# Patient Record
Sex: Female | Born: 1980 | Race: White | Hispanic: Yes | Marital: Married | Smoking: Never smoker
Health system: Southern US, Community
[De-identification: ages and names within clinical notes are randomized; demographics above are authoritative.]

## PROBLEM LIST (undated history)

## (undated) ENCOUNTER — Inpatient Hospital Stay (HOSPITAL_COMMUNITY): Payer: Self-pay

## (undated) DIAGNOSIS — O24419 Gestational diabetes mellitus in pregnancy, unspecified control: Secondary | ICD-10-CM

## (undated) DIAGNOSIS — D649 Anemia, unspecified: Secondary | ICD-10-CM

## (undated) DIAGNOSIS — E119 Type 2 diabetes mellitus without complications: Secondary | ICD-10-CM

## (undated) DIAGNOSIS — R809 Proteinuria, unspecified: Secondary | ICD-10-CM

## (undated) HISTORY — DX: Type 2 diabetes mellitus without complications: E11.9

## (undated) HISTORY — DX: Proteinuria, unspecified: R80.9

## (undated) HISTORY — DX: Gestational diabetes mellitus in pregnancy, unspecified control: O24.419

## (undated) HISTORY — PX: NO PAST SURGERIES: SHX2092

---

## 2005-12-23 ENCOUNTER — Emergency Department (HOSPITAL_COMMUNITY): Admission: EM | Admit: 2005-12-23 | Discharge: 2005-12-23 | Payer: Self-pay | Admitting: Emergency Medicine

## 2010-12-18 ENCOUNTER — Inpatient Hospital Stay (HOSPITAL_COMMUNITY)
Admission: AD | Admit: 2010-12-18 | Discharge: 2010-12-18 | Disposition: A | Discharge status: Home / Self Care | Payer: Self-pay | Source: Ambulatory Visit | Attending: Obstetrics & Gynecology | Admitting: Obstetrics & Gynecology

## 2010-12-18 ENCOUNTER — Inpatient Hospital Stay (HOSPITAL_COMMUNITY): Payer: Self-pay

## 2010-12-18 ENCOUNTER — Encounter (HOSPITAL_COMMUNITY): Payer: Self-pay | Admitting: *Deleted

## 2010-12-18 DIAGNOSIS — O209 Hemorrhage in early pregnancy, unspecified: Secondary | ICD-10-CM | POA: Insufficient documentation

## 2010-12-18 LAB — URINALYSIS, ROUTINE W REFLEX MICROSCOPIC
Leukocytes, UA: NEGATIVE
Nitrite: NEGATIVE
Specific Gravity, Urine: >1.03 — ABNORMAL HIGH (ref 1.005–1.030)
Urobilinogen, UA: 0.2 mg/dL (ref 0.0–1.0)

## 2010-12-18 LAB — POCT PREGNANCY, URINE: Preg Test, Ur: POSITIVE

## 2010-12-18 LAB — CBC
MCH: 25.2 pg — ABNORMAL LOW (ref 26.0–34.0)
Platelets: 270 10*3/uL (ref 150–400)
RBC: 4.44 MIL/uL (ref 3.87–5.11)
WBC: 7.2 10*3/uL (ref 4.0–10.5)

## 2010-12-18 LAB — URINE MICROSCOPIC-ADD ON

## 2010-12-18 MED ORDER — PROMETHAZINE HCL 25 MG PO TABS: 25.0000 mg | ORAL_TABLET | Freq: Three times a day (TID) | ORAL | Status: AC | PRN

## 2010-12-18 NOTE — ED Provider Notes (Signed)
History     Chief Complaint  Patient presents with  . Vaginal Bleeding  . Emesis   HPI Joanne Roberts is 30 y.o. G3P2002 [redacted]w[redacted]d weeks presenting with vaginal bleeding.  Plans prenatal care at health dept.  Saw pink discharge 6 days ago and then today it was red.  Last intercourse more than 1 month ago. Had fever 6 days ago. Has had nausea and vomiting this week.  Has a cold not sure if it is the cold or the pregnancy.  Has "pin prick" pain in her lower abdomen bilaterally today.     Past Medical History  Diagnosis Date  . No pertinent past medical history     Past Surgical History  Procedure Date  . No past surgeries     No family history on file.  History  Substance Use Topics  . Smoking status: Never Smoker   . Smokeless tobacco: Not on file  . Alcohol Use: No    Allergies: No Known Allergies  Prescriptions prior to admission  Medication Sig Dispense Refill  . acetaminophen (TYLENOL) 325 MG tablet Take 650 mg by mouth every 6 (six) hours as needed. Patient used this medication for pain.         Review of Systems  Constitutional: Negative.   HENT: Negative.   Gastrointestinal: Positive for abdominal pain (pin prick pain lower abdomen bilaterally).  Genitourinary:       + vaginal bleeding   Physical Exam   Blood pressure 116/84, pulse 63, temperature 98.1 F (36.7 C), temperature source Oral, resp. rate 20, height 5' 1.5" (1.562 m), weight 180 lb 9.6 oz (81.92 kg), last menstrual period 09/20/2010, SpO2 99.00%.  Physical Exam  Constitutional: She is oriented to person, place, and time. She appears well-developed and well-nourished. No distress.  HENT:  Head: Normocephalic.  Neck: Normal range of motion.  Cardiovascular: Normal rate.   Respiratory: Effort normal.  GI: Soft. She exhibits no distension and no mass. There is no tenderness. There is no rebound and no guarding.  Genitourinary: Uterus is enlarged (measures less than 12 weeks in size). Uterus is not  tender. Right adnexum displays no mass, no tenderness and no fullness. Left adnexum displays no mass, no tenderness and no fullness. There is bleeding (pink tinged discharge) around the vagina. Vaginal discharge (malodorous discharge) found.  Neurological: She is alert and oriented to person, place, and time.  Skin: Skin is warm and dry.   Results for orders placed during the hospital encounter of 12/18/10 (from the past 24 hour(s))  URINALYSIS, ROUTINE W REFLEX MICROSCOPIC     Status: Abnormal   Collection Time   12/18/10  3:30 PM      Component Value Range   Color, Urine YELLOW  YELLOW    APPearance CLEAR  CLEAR    Specific Gravity, Urine >1.030 (*) 1.005 - 1.030    pH 5.5  5.0 - 8.0    Glucose, UA NEGATIVE  NEGATIVE (mg/dL)   Hgb urine dipstick TRACE (*) NEGATIVE    Bilirubin Urine NEGATIVE  NEGATIVE    Ketones, ur NEGATIVE  NEGATIVE (mg/dL)   Protein, ur NEGATIVE  NEGATIVE (mg/dL)   Urobilinogen, UA 0.2  0.0 - 1.0 (mg/dL)   Nitrite NEGATIVE  NEGATIVE    Leukocytes, UA NEGATIVE  NEGATIVE   URINE MICROSCOPIC-ADD ON     Status: Abnormal   Collection Time   12/18/10  3:30 PM      Component Value Range   Squamous Epithelial / LPF  MANY (*) RARE    RBC / HPF 0-2  <3 (RBC/hpf)   Bacteria, UA RARE  RARE   POCT PREGNANCY, URINE     Status: Normal   Collection Time   12/18/10  3:56 PM      Component Value Range   Preg Test, Ur POSITIVE    WET PREP, GENITAL     Status: Abnormal   Collection Time   12/18/10  5:00 PM      Component Value Range   Yeast, Wet Prep NONE SEEN  NONE SEEN    Trich, Wet Prep NONE SEEN  NONE SEEN    Clue Cells, Wet Prep NONE SEEN  NONE SEEN    WBC, Wet Prep HPF POC MANY (*) NONE SEEN   ABO/RH     Status: Normal   Collection Time   12/18/10  5:15 PM      Component Value Range   ABO/RH(D) A POS    CBC     Status: Abnormal   Collection Time   12/18/10  5:15 PM      Component Value Range   WBC 7.2  4.0 - 10.5 (K/uL)   RBC 4.44  3.87 - 5.11 (MIL/uL)    Hemoglobin 11.2 (*) 12.0 - 15.0 (g/dL)   HCT 16.1 (*) 09.6 - 46.0 (%)   MCV 77.5 (*) 78.0 - 100.0 (fL)   MCH 25.2 (*) 26.0 - 34.0 (pg)   MCHC 32.6  30.0 - 36.0 (g/dL)   RDW 04.5 (*) 40.9 - 15.5 (%)   Platelets 270  150 - 400 (K/uL)       *RADIOLOGY REPORT*  Clinical Data: size<dates, bleeding/mild pain bilaterally,  OBSTETRIC <14 WK [place]  Technique: Transabdominal ultrasound examination was performed for  complete evaluation of the gestation as well as the maternal  uterus, adnexal regions, and pelvic cul-de-sac.  Comparison: None.  Findings: There is a single intrauterine gestation. Crown-rump  length is 1.09 cm for an estimated gestational age of [redacted] weeks 2  days. Fetal heart rate 158 beats per minute. Small subchorionic  hemorrhage present.  Resolving right corpus luteal cyst. No adnexal masses or free  fluid.  IMPRESSION:  7-week-2-day intrauterine pregnancy. Fetal heart rate 158 beats  per minute. Small subchorionic hemorrhage.  Original Report Authenticated By: Cyndie Chime, M.D.    MAU Course  Procedures GC/CHL culture to lab  MDM  At discharge patient is asking for Rx for  Nausea.  Will write Phenergan tabs  Assessment and Plan  A: Intrauterine pregnancy at [redacted]w[redacted]d with small subchorionic hemorrhage     Vaginal bleeding in early pregnancy  P:  Avoid intercourse until bleeding resolves      Begin prenatal care with the doctor of your choice  Matt Holmes 12/18/2010, 4:44 PM   Matt Holmes, NP 12/18/10 1823  Matt Holmes, NP 12/18/10 1830

## 2010-12-18 NOTE — Progress Notes (Signed)
Patient states she has had a positive home pregnancy test. Has been having nausea and vomiting for about one week. Has some on and off pin prick like pain on both sides of the abdomen. States she had bright red bleeding this am, but none now.

## 2010-12-19 LAB — GC/CHLAMYDIA PROBE AMP, GENITAL
Chlamydia, DNA Probe: NEGATIVE
GC Probe Amp, Genital: NEGATIVE

## 2011-01-13 NOTE — L&D Delivery Note (Signed)
Delivery Note At 2:35 PM a viable and healthy female was delivered via spontaneous vaginal delivery  (Presentation: occiput anterior  ).  APGAR: pending ; weight .   Placenta status: intact, .  Cord: 3 vessels, centrally located  with the following complications: none.   Anesthesia:  none Episiotomy: none Lacerations: 1st degree Suture Repair: 2.0 vicryl Est. Blood Loss (mL): 300  Mom to postpartum.  Baby to nursery-stable.  Marikay Alar 07/29/2011, 3:10 PM

## 2011-01-13 NOTE — L&D Delivery Note (Signed)
I attended delivery. Agree with note.  No difficulty with shoulders.

## 2011-03-28 ENCOUNTER — Encounter (HOSPITAL_COMMUNITY): Payer: Self-pay | Admitting: Obstetrics and Gynecology

## 2011-03-28 ENCOUNTER — Inpatient Hospital Stay (HOSPITAL_COMMUNITY)
Admission: AD | Admit: 2011-03-28 | Discharge: 2011-03-28 | Disposition: A | Discharge status: Home / Self Care | Payer: Self-pay | Source: Ambulatory Visit | Attending: Family Medicine | Admitting: Family Medicine

## 2011-03-28 ENCOUNTER — Inpatient Hospital Stay (HOSPITAL_COMMUNITY): Payer: Self-pay

## 2011-03-28 DIAGNOSIS — O4690 Antepartum hemorrhage, unspecified, unspecified trimester: Secondary | ICD-10-CM | POA: Insufficient documentation

## 2011-03-28 DIAGNOSIS — O4692 Antepartum hemorrhage, unspecified, second trimester: Secondary | ICD-10-CM

## 2011-03-28 LAB — URINALYSIS, ROUTINE W REFLEX MICROSCOPIC
Bilirubin Urine: NEGATIVE
Ketones, ur: NEGATIVE mg/dL
Nitrite: NEGATIVE
pH: 7 (ref 5.0–8.0)

## 2011-03-28 LAB — URINE MICROSCOPIC-ADD ON

## 2011-03-28 LAB — CBC
HCT: 28.3 % — ABNORMAL LOW (ref 36.0–46.0)
Hemoglobin: 8.9 g/dL — ABNORMAL LOW (ref 12.0–15.0)
RDW: 15.9 % — ABNORMAL HIGH (ref 11.5–15.5)
WBC: 8.9 10*3/uL (ref 4.0–10.5)

## 2011-03-28 LAB — WET PREP, GENITAL: Yeast Wet Prep HPF POC: NONE SEEN

## 2011-03-28 NOTE — MAU Provider Note (Signed)
  History     CSN: [id number]  Arrival date and time: 03/28/11 1416   First Provider Initiated Contact with Patient 03/28/11 1443      Chief Complaint  Patient presents with  . Vaginal Bleeding  . Abdominal Cramping   HPI Patient presents complaining of vaginal bleeding that began this morning.  She is also having some lower pelvic pain/cramping.  She is a G3P2002 at [redacted]w[redacted]d.  She has not yet seen a doctor for prenatal care except for a visit to the MAU when she was 7 weeks and had some bleeding.  She denies leakage of fluid, and reports good fetal movement.   OB History    Grav Para Term Preterm Abortions TAB SAB Ect Mult Living   3 2 2  0 0 0 0 0 0 2      Past Medical History  Diagnosis Date  . No pertinent past medical history     Past Surgical History  Procedure Date  . No past surgeries     History reviewed. No pertinent family history.  History  Substance Use Topics  . Smoking status: Never Smoker   . Smokeless tobacco: Not on file  . Alcohol Use: No    Allergies: No Known Allergies  Prescriptions prior to admission  Medication Sig Dispense Refill  . acetaminophen (TYLENOL) 325 MG tablet Take 650 mg by mouth every 6 (six) hours as needed. Patient used this medication for pain.         ROS: Pertinent items in HPI.  Physical Exam   Blood pressure 118/55, pulse 85, temperature 98.3 F (36.8 C), temperature source Oral, resp. rate 18, last menstrual period 09/20/2010.  Physical Exam  Constitutional: She appears well-developed and well-nourished. No distress.  HENT:  Head: Normocephalic and atraumatic.  Cardiovascular: Normal rate, regular rhythm and normal heart sounds.   Respiratory: Effort normal and breath sounds normal.  GI:       Gravid, non-tender.   Genitourinary: Vagina normal.       There is blood in cervical os and vaginal vault.  No clots.     Lab Results  Component Value Date   WBC 8.9 03/28/2011   HGB 8.9* 03/28/2011   HCT 28.3* 03/28/2011    MCV 77.3* 03/28/2011   PLT 259 03/28/2011    MAU Course  Procedures  MDM Pre-viable pregnancy, pt presents with bleeding, 7 week ultrasound showed small subchorionic hemorrhage.  Obtained GC/Chlamydia, Wet prep, and UA, will also get [place] to eval fetus and placenta.  03/28/2011 3:17 PM   US shows normal fetal heart activity, normal cervix, Amniotic Fluid, with no placental abruption or previa.     Assessment and Plan  Second trimester bleeding, unclear etiology.  Will discharge hope, patient has appointment at [facility] clinic in about two weeks.  Advised her to start taking over the counter iron pills.   Joanne Roberts 03/28/2011, 3:06 PM

## 2011-03-28 NOTE — Discharge Instructions (Signed)
Hemorragia vaginal durante el embarazo, segundo trimestre (Vaginal Bleeding During Pregnancy, Second Trimester) Durante el embarazo es relativamente frecuente que se presente una pequea hemorragia (manchas). Esta situacin generalmente mejora por s misma. Existen muchas causas para la hemorragia o prdidas durante el embarazo. Algunas hemorragias pueden estar relacionadas al [employer] y otras no. Si aparecen retortijones con la hemorragia es ms serio y preocupante. Informe al mdico si tiene cualquier tipo de hemorragia vaginal.  CAUSAS  Infeccin, inflamacin o tumores en el tero.   La placenta puede estar cubriendo la apertura del tero de [place] total o parcial.   La placenta puede haberse separado del tero.   Usted puede estar sufriendo un trabajo de parto prematuro.   El tero no est lo suficientemente fuerte para Pharmacologist al beb dentro del tero (insuficiencia cervical).   Muchos quistes pequeos en el tero en lugar del tejido del embarazo (embarazo molar).  SNTOMAS  Hemorragia o goteo vaginal con o sin retortijones.   Contracciones uterinas.   Flujo vaginal anormal.   Podra tener goteo despus de Management consultant.  DIAGNSTICO Para evaluar el embarazo, el mdico podr:  Education officer, environmental un examen plvico.   Tomar anlisis de [place].   Realizar una prueba de [place].  Es importante seguir las indicaciones del profesional que la asiste.  TRATAMIENTO  Evaluacin del embarazo con anlisis de [place] y [place].   Reposo en cama (slo levantarse para ir al bao).   Inmunizacin con Rho-gam si la madre es Rh negativo y el padre es Rh positivo.   Si tiene contracciones uterinas, se le podrn administrar medicamentos para detenerlas.   Si tiene insuficiencia cervical, se le podr realizar una sutura en el tero para cerrarlo.  INSTRUCCIONES PARA EL CUIDADO DOMICILIARIO  Si el mdico le indica reposo en cama, usted necesitar hacer algunos arreglos para  que otra persona se ocupe del cuidado de los nios y de otras responsabilidades adicionales. Sin embargo, podr autorizarla a Building surveyor.   Lleve un registro de la cantidad y la saturacin de las toallas higinicas que Landscape architect. Escriba esta informacin:   No use tampones. No utilice duchas vaginales.   No tenga relaciones sexuales u orgasmos hasta que el mdico la autorice.   Guarde una muestra de los tejidos para que el profesional lo inspeccione.   Tome medicamentos para el dolor slo con el permiso del mdico.   No tome aspirina, ya que puede causar hemorragias.   No realice ejercicio, actividades estresantes ni levante peso sin el permiso del mdico.  SOLICITE ATENCIN MDICA INMEDIATAMENTE SI:  Siente calambres intensos en el estmago, en la espalda o en el vientre (abdomen).   Tiene contracciones uterinas.   Usted tiene una temperatura oral de ms de 102 F (38.9 C) y no puede controlarla con medicamentos.   Comienza a sentir escalofros.   Elimina cogulos o tejidos grandes.   La hemorragia aumenta o se siente mareada dbil o tiene episodios de desmayos.   Tiene una prdida de lquido por la vagina.  Document Released: 10/08/2004 Document Revised: 12/18/2010 [facility] Patient Information 2012 [place], [place].

## 2011-03-28 NOTE — MAU Note (Signed)
Pt presents to MAU with chief complaint of vaginal spotting and abdominal cramping. Pt is 21w4 days; G3P2; no history of pre-term delivery. Pain and spotting started this morning. Pt says she has been more uncomfortable lately.

## 2011-03-28 NOTE — MAU Provider Note (Signed)
Chart reviewed and agree with management and plan.  

## 2011-03-30 LAB — GC/CHLAMYDIA PROBE AMP, GENITAL: Chlamydia, DNA Probe: NEGATIVE

## 2011-04-08 ENCOUNTER — Encounter: Payer: Self-pay | Admitting: Physician Assistant

## 2011-04-08 ENCOUNTER — Ambulatory Visit (INDEPENDENT_AMBULATORY_CARE_PROVIDER_SITE_OTHER): Payer: Self-pay | Admitting: Physician Assistant

## 2011-04-08 VITALS — BP 100/74 | Temp 97.1°F | Wt 181.6 lb

## 2011-04-08 DIAGNOSIS — O0992 Supervision of high risk pregnancy, unspecified, second trimester: Secondary | ICD-10-CM | POA: Insufficient documentation

## 2011-04-08 DIAGNOSIS — Z3490 Encounter for supervision of normal pregnancy, unspecified, unspecified trimester: Secondary | ICD-10-CM

## 2011-04-08 DIAGNOSIS — O99019 Anemia complicating pregnancy, unspecified trimester: Secondary | ICD-10-CM | POA: Insufficient documentation

## 2011-04-08 DIAGNOSIS — O4690 Antepartum hemorrhage, unspecified, unspecified trimester: Secondary | ICD-10-CM | POA: Insufficient documentation

## 2011-04-08 LAB — POCT URINALYSIS DIP (DEVICE)
Bilirubin Urine: NEGATIVE
Glucose, UA: NEGATIVE mg/dL
Nitrite: NEGATIVE

## 2011-04-08 NOTE — Progress Notes (Signed)
Edema- feet, vaginal area.  Pain/pressure- tender on "top of vagina".  Pulse- 68

## 2011-04-08 NOTE — Patient Instructions (Signed)
Prevencin de parto prematuro (Preventing Preterm Labor) Un parto prematuro ocurre cuando la mujer embarazada tiene contracciones uterinas que causan la apertura, el acortamiento y el afinamiento del cuello del tero, antes de las 37 semanas de embarazo. Tendr contracciones regulares cada 2 a 3 minutos. Esto generalmente causa molestias o dolor. CUIDADOS EN EL HOGAR  Consuma una dieta saludable.   Tome las vitaminas segn le haya indicado el mdico.   Beba una cantidad de lquido suficiente como para mantener la orina de tono claro o color amarillo plido todos los das.   Descanse y duerma.   No tenga relaciones sexuales si tiene un parto prematuro o alto riesgo de tenerlo.   Siga las instrucciones del mdico acerca de su actividad, los medicamentos y los exmenes.   Evite el estrs.   Evite los esfuerzos extenuantes o la actividad fsica extensa.   No fume.  SOLICITE AYUDA DE INMEDIATO SI:   Tiene contracciones.   Siente dolor abdominal.   Tiene sangrado que proviene de la vagina.   Siente dolor al orinar.   Observa una secrecin anormal que proviene de la vagina.   Tiene una temperatura oral de ms de 102 F (38.9 C).  ASEGRESE DE QUE:  Comprende estas instrucciones.   Controlar su enfermedad.   Solicitar ayuda si no mejora o si empeora.  Document Released: 01/31/2010 Document Revised: 12/18/2010 [employer] Patient Information 2012 [employer]. 

## 2011-04-08 NOTE — Progress Notes (Signed)
   Subjective:    Joanne Roberts is a [id number] [redacted]w[redacted]d being seen today for her first obstetrical visit.  Her obstetrical history is significant for obesity. Patient does intend to breast feed. Pregnancy history fully reviewed.  Patient reports backache, bleeding (seen in MAU on 3/16 for this), no contractions, no cramping and no leaking.  Filed Vitals:   04/08/11 0819  BP: 100/74  Temp: 97.1 F (36.2 C)  Weight: 181 lb 9.6 oz (82.373 kg)    HISTORY: OB History    Grav Para Term Preterm Abortions TAB SAB Ect Mult Living   3 2 2  0 0 0 0 0 0 2     # Outc Date GA Lbr Len/2nd Wgt Sex Del Anes PTL Lv   1 TRM 2004 [redacted]w[redacted]d  6lb14oz(3.118kg) M SVD None  Yes   2 TRM 2007 [redacted]w[redacted]d  8lb12oz(3.969kg) F SVD None  Yes   3 CUR              Past Medical History  Diagnosis Date  . No pertinent past medical history    Past Surgical History  Procedure Date  . No past surgeries    No family history on file.   Exam    Uterine Size: 26 cm  Pelvic Exam:    Perineum: No Hemorrhoids   Vulva: normal   Vagina:  normal mucosa, normal discharge   pH: n/a   Cervix: no cervical motion tenderness and friable   Adnexa: normal adnexa   Bony Pelvis: average  System: Breast:  normal appearance, no masses or tenderness, deferred   Skin: normal coloration and turgor, no rashes    Neurologic: oriented, normal   Extremities: normal strength, tone, and muscle mass   HEENT extra ocular movement intact, sclera clear, anicteric, oropharynx clear, no lesions and neck supple with midline trachea   Mouth/Teeth mucous membranes moist, pharynx normal without lesions   Neck supple and no masses   Cardiovascular: regular rate and rhythm   Respiratory:  appears well, vitals normal, no respiratory distress, acyanotic, normal RR, ear and throat exam is normal, neck free of mass or lymphadenopathy, chest clear, no wheezing, crepitations, rhonchi, normal symmetric air entry   Abdomen: gravid, NTND   Urinary: urethral  meatus normal      Assessment:    Pregnancy: G3P2002 There is no problem list on file for this patient.       Plan:     Initial labs drawn.6} Prenatal vitamins.{GENETIC SCREENING JYNW:2956 Problem list reviewed and updated. Genetic Screening discussed : too late for screening.  Ultrasound discussed; fetal survey: beyond optimal dating range.  Follow up in 4 weeks. 50% of 40 min visit spent on counseling and coordination of care.  Continue prenatal vitamin Encouraged increased dietary iron Preterm, bleeding precautions reviewed Kick counts reviewed Discussed appropriate weight gain   BOOTH, Yacqub Baston 04/08/2011

## 2011-04-09 LAB — HIV ANTIBODY (ROUTINE TESTING W REFLEX): HIV: NONREACTIVE

## 2011-04-09 LAB — ANTIBODY SCREEN: Antibody Screen: NEGATIVE

## 2011-04-09 LAB — RPR

## 2011-04-09 LAB — GLUCOSE TOLERANCE, 1 HOUR: Glucose, 1 Hour GTT: 180 mg/dL — ABNORMAL HIGH (ref 70–140)

## 2011-04-10 LAB — HEMOGLOBINOPATHY EVALUATION
Hemoglobin Other: 0 %
Hgb S Quant: 0 %

## 2011-04-10 LAB — CULTURE, OB URINE: Colony Count: NO GROWTH

## 2011-04-13 ENCOUNTER — Telehealth: Payer: Self-pay | Admitting: *Deleted

## 2011-04-13 NOTE — Telephone Encounter (Signed)
Message copied by Jill Side on Mon Apr 13, 2011  2:47 PM ------      Message from: POE, DEIRDRE C      Created: Thu Apr 09, 2011  4:47 PM       Schedule 3 hr OGTT

## 2011-04-13 NOTE — Telephone Encounter (Signed)
Called pt and left message that I was calling to discuss results and appt needed. Please call back to nurse voice mail and leave message of when she may be reached.

## 2011-04-14 NOTE — Telephone Encounter (Signed)
Pt returned my call and I informed her of need for 3 hr GTT.  All instructions for test were given. Pt agreed, voiced understanding and will come in for test on 04/21/11 @ 0800-0830.

## 2011-04-21 ENCOUNTER — Other Ambulatory Visit: Payer: Self-pay

## 2011-04-21 ENCOUNTER — Encounter: Payer: Self-pay | Admitting: Obstetrics & Gynecology

## 2011-04-21 DIAGNOSIS — O99810 Abnormal glucose complicating pregnancy: Secondary | ICD-10-CM

## 2011-04-22 ENCOUNTER — Encounter: Payer: Self-pay | Admitting: Family Medicine

## 2011-04-22 LAB — GLUCOSE TOLERANCE, 3 HOURS: Glucose Tolerance, 2 hour: 142 mg/dL (ref 70–164)

## 2011-05-06 ENCOUNTER — Ambulatory Visit (INDEPENDENT_AMBULATORY_CARE_PROVIDER_SITE_OTHER): Payer: Self-pay | Admitting: Obstetrics and Gynecology

## 2011-05-06 VITALS — BP 105/67 | Temp 98.0°F | Wt 184.6 lb

## 2011-05-06 DIAGNOSIS — O4690 Antepartum hemorrhage, unspecified, unspecified trimester: Secondary | ICD-10-CM

## 2011-05-06 DIAGNOSIS — Z3490 Encounter for supervision of normal pregnancy, unspecified, unspecified trimester: Secondary | ICD-10-CM

## 2011-05-06 LAB — POCT URINALYSIS DIP (DEVICE)
Ketones, ur: NEGATIVE mg/dL
Protein, ur: 30 mg/dL — AB
pH: 6 (ref 5.0–8.0)

## 2011-05-06 NOTE — Progress Notes (Signed)
Not eating sweets. No UTI sx. Check C&S. Pelvic pressure with walking and moving. RLP discussed.

## 2011-05-06 NOTE — Progress Notes (Signed)
Pulse 75 Pressure in pelvic area.

## 2011-05-06 NOTE — Progress Notes (Signed)
Addended by: Doreen Salvage on: 05/06/2011 09:53 AM   Modules accepted: Orders

## 2011-05-06 NOTE — Patient Instructions (Signed)

## 2011-05-08 LAB — CULTURE, OB URINE

## 2011-05-20 ENCOUNTER — Ambulatory Visit (INDEPENDENT_AMBULATORY_CARE_PROVIDER_SITE_OTHER): Payer: Self-pay | Admitting: Advanced Practice Midwife

## 2011-05-20 ENCOUNTER — Encounter: Payer: Self-pay | Admitting: Advanced Practice Midwife

## 2011-05-20 VITALS — BP 103/68 | Temp 97.1°F | Wt 183.5 lb

## 2011-05-20 DIAGNOSIS — O99019 Anemia complicating pregnancy, unspecified trimester: Secondary | ICD-10-CM

## 2011-05-20 DIAGNOSIS — D649 Anemia, unspecified: Secondary | ICD-10-CM

## 2011-05-20 LAB — POCT URINALYSIS DIP (DEVICE)
Bilirubin Urine: NEGATIVE
Glucose, UA: NEGATIVE mg/dL
Ketones, ur: NEGATIVE mg/dL
Specific Gravity, Urine: 1.01 (ref 1.005–1.030)

## 2011-05-20 NOTE — Progress Notes (Signed)
Pulse: 74 Pt complains of severe pelvic pain, states that at times she has pink tinged discharge. Also reports feeling dizzy at times.

## 2011-05-20 NOTE — Patient Instructions (Signed)
Pregnancy - Third Trimester The third trimester of pregnancy (the last 3 months) is a period of the most rapid growth for you and your baby. The baby approaches a length of 20 inches and a weight of 6 to 10 pounds. The baby is adding on fat and getting ready for life outside your body. While inside, babies have periods of sleeping and waking, suck their thumbs, and hiccups. You can often feel small contractions of the uterus. This is false labor. It is also called Braxton-Hicks contractions. This is like a practice for labor. The usual problems in this stage of pregnancy include more difficulty breathing, swelling of the hands and feet from water retention, and having to urinate more often because of the uterus and baby pressing on your bladder.  PRENATAL EXAMS  Blood work may continue to be done during prenatal exams. These tests are done to check on your health and the probable health of your baby. Blood work is used to follow your blood levels (hemoglobin). Anemia (low hemoglobin) is common during pregnancy. Iron and vitamins are given to help prevent this. You may also continue to be checked for diabetes. Some of the past blood tests may be done again.   The size of the uterus is measured during each visit. This makes sure your baby is growing properly according to your pregnancy dates.   Your blood pressure is checked every prenatal visit. This is to make sure you are not getting toxemia.   Your urine is checked every prenatal visit for infection, diabetes and protein.   Your weight is checked at each visit. This is done to make sure gains are happening at the suggested rate and that you and your baby are growing normally.   Sometimes, an ultrasound is performed to confirm the position and the proper growth and development of the baby. This is a test done that bounces harmless sound waves off the baby so your caregiver can more accurately determine due dates.   Discuss the type of pain  medication and anesthesia you will have during your labor and delivery.   Discuss the possibility and anesthesia if a Cesarean Section might be necessary.   Inform your caregiver if there is any mental or physical violence at home.  Sometimes, a specialized non-stress test, contraction stress test and biophysical profile are done to make sure the baby is not having a problem. Checking the amniotic fluid surrounding the baby is called an amniocentesis. The amniotic fluid is removed by sticking a needle into the belly (abdomen). This is sometimes done near the end of pregnancy if an early delivery is required. In this case, it is done to help make sure the baby's lungs are mature enough for the baby to live outside of the womb. If the lungs are not mature and it is unsafe to deliver the baby, an injection of cortisone medication is given to the mother 1 to 2 days before the delivery. This helps the baby's lungs mature and makes it safer to deliver the baby. CHANGES OCCURING IN THE THIRD TRIMESTER OF PREGNANCY Your body goes through many changes during pregnancy. They vary from person to person. Talk to your caregiver about changes you notice and are concerned about.  During the last trimester, you have probably had an increase in your appetite. It is normal to have cravings for certain foods. This varies from person to person and pregnancy to pregnancy.   You may begin to get stretch marks on your hips,   abdomen, and breasts. These are normal changes in the body during pregnancy. There are no exercises or medications to take which prevent this change.   Constipation may be treated with a stool softener or adding bulk to your diet. Drinking lots of fluids, fiber in vegetables, fruits, and whole grains are helpful.   Exercising is also helpful. If you have been very active up until your pregnancy, most of these activities can be continued during your pregnancy. If you have been less active, it is helpful  to start an exercise program such as walking. Consult your caregiver before starting exercise programs.   Avoid all smoking, alcohol, un-prescribed drugs, herbs and "street drugs" during your pregnancy. These chemicals affect the formation and growth of the baby. Avoid chemicals throughout the pregnancy to ensure the delivery of a healthy infant.   Backache, varicose veins and hemorrhoids may develop or get worse.   You will tire more easily in the third trimester, which is normal.   The baby's movements may be stronger and more often.   You may become short of breath easily.   Your belly button may stick out.   A yellow discharge may leak from your breasts called colostrum.   You may have a bloody mucus discharge. This usually occurs a few days to a week before labor begins.  HOME CARE INSTRUCTIONS   Keep your caregiver's appointments. Follow your caregiver's instructions regarding medication use, exercise, and diet.   During pregnancy, you are providing food for you and your baby. Continue to eat regular, well-balanced meals. Choose foods such as meat, fish, milk and other low fat dairy products, vegetables, fruits, and whole-grain breads and cereals. Your caregiver will tell you of the ideal weight gain.   A physical sexual relationship may be continued throughout pregnancy if there are no other problems such as early (premature) leaking of amniotic fluid from the membranes, vaginal bleeding, or belly (abdominal) pain.   Exercise regularly if there are no restrictions. Check with your caregiver if you are unsure of the safety of your exercises. Greater weight gain will occur in the last 2 trimesters of pregnancy. Exercising helps:   Control your weight.   Get you in shape for labor and delivery.   You lose weight after you deliver.   Rest a lot with legs elevated, or as needed for leg cramps or low back pain.   Wear a good support or jogging bra for breast tenderness during  pregnancy. This may help if worn during sleep. Pads or tissues may be used in the bra if you are leaking colostrum.   Do not use hot tubs, steam rooms, or saunas.   Wear your seat belt when driving. This protects you and your baby if you are in an accident.   Avoid raw meat, cat litter boxes and soil used by cats. These carry germs that can cause birth defects in the baby.   It is easier to loose urine during pregnancy. Tightening up and strengthening the pelvic muscles will help with this problem. You can practice stopping your urination while you are going to the bathroom. These are the same muscles you need to strengthen. It is also the muscles you would use if you were trying to stop from passing gas. You can practice tightening these muscles up 10 times a set and repeating this about 3 times per day. Once you know what muscles to tighten up, do not perform these exercises during urination. It is more likely   to cause an infection by backing up the urine.   Ask for help if you have financial, counseling or nutritional needs during pregnancy. Your caregiver will be able to offer counseling for these needs as well as refer you for other special needs.   Make a list of emergency phone numbers and have them available.   Plan on getting help from family or friends when you go home from the hospital.   Make a trial run to the hospital.   Take prenatal classes with the father to understand, practice and ask questions about the labor and delivery.   Prepare the baby's room/nursery.   Do not travel out of the city unless it is absolutely necessary and with the advice of your caregiver.   Wear only low or no heal shoes to have better balance and prevent falling.  MEDICATIONS AND DRUG USE IN PREGNANCY  Take prenatal vitamins as directed. The vitamin should contain 1 milligram of folic acid. Keep all vitamins out of reach of children. Only a couple vitamins or tablets containing iron may be fatal  to a baby or young child when ingested.   Avoid use of all medications, including herbs, over-the-counter medications, not prescribed or suggested by your caregiver. Only take over-the-counter or prescription medicines for pain, discomfort, or fever as directed by your caregiver. Do not use aspirin, ibuprofen (Motrin, Advil, Nuprin) or naproxen (Aleve) unless OK'd by your caregiver.   Let your caregiver also know about herbs you may be using.   Alcohol is related to a number of birth defects. This includes fetal alcohol syndrome. All alcohol, in any form, should be avoided completely. Smoking will cause low birth rate and premature babies.   Street/illegal drugs are very harmful to the baby. They are absolutely forbidden. A baby born to an addicted mother will be addicted at birth. The baby will go through the same withdrawal an adult does.  SEEK MEDICAL CARE IF: You have any concerns or worries during your pregnancy. It is better to call with your questions if you feel they cannot wait, rather than worry about them. DECISIONS ABOUT CIRCUMCISION You may or may not know the sex of your baby. If you know your baby is a boy, it may be time to think about circumcision. Circumcision is the removal of the foreskin of the penis. This is the skin that covers the sensitive end of the penis. There is no proven medical need for this. Often this decision is made on what is popular at the time or based upon religious beliefs and social issues. You can discuss these issues with your caregiver or pediatrician. SEEK IMMEDIATE MEDICAL CARE IF:   An unexplained oral temperature above 102 F (38.9 C) develops, or as your caregiver suggests.   You have leaking of fluid from the vagina (birth canal). If leaking membranes are suspected, take your temperature and tell your caregiver of this when you call.   There is vaginal spotting, bleeding or passing clots. Tell your caregiver of the amount and how many pads are  used.   You develop a bad smelling vaginal discharge with a change in the color from clear to white.   You develop vomiting that lasts more than 24 hours.   You develop chills or fever.   You develop shortness of breath.   You develop burning on urination.   You loose more than 2 pounds of weight or gain more than 2 pounds of weight or as suggested by your   caregiver.   You notice sudden swelling of your face, hands, and feet or legs.   You develop belly (abdominal) pain. Round ligament discomfort is a common non-cancerous (benign) cause of abdominal pain in pregnancy. Your caregiver still must evaluate you.   You develop a severe headache that does not go away.   You develop visual problems, blurred or double vision.   If you have not felt your baby move for more than 1 hour. If you think the baby is not moving as much as usual, eat something with sugar in it and lie down on your left side for an hour. The baby should move at least 4 to 5 times per hour. Call right away if your baby moves less than that.   You fall, are in a car accident or any kind of trauma.   There is mental or physical violence at home.  Document Released: 12/23/2000 Document Revised: 12/18/2010 Document Reviewed: 06/27/2008 ExitCare Patient Information 2012 [employer]. 

## 2011-05-20 NOTE — Progress Notes (Signed)
C/O pelvic pain in area of symphysis pubis, worse with position changes, walking. Irregular, mild contractions. Occasional pink discharge. Pelvic: cervix long and closed, yellowish discharge, wet prep collected. Rev'd comfort measures, recommended maternity support brace. Also c/o occasional episodes of dizziness, last one a few days ago, resolved with rest and drinking water. Rev'd precautions.

## 2011-05-20 NOTE — Progress Notes (Signed)
Addended by: Doreen Salvage on: 05/20/2011 08:54 AM   Modules accepted: Orders

## 2011-05-21 LAB — WET PREP, GENITAL
Trich, Wet Prep: NONE SEEN
Yeast Wet Prep HPF POC: NONE SEEN

## 2011-05-22 LAB — CULTURE, OB URINE: Colony Count: 40000

## 2011-06-03 ENCOUNTER — Ambulatory Visit (INDEPENDENT_AMBULATORY_CARE_PROVIDER_SITE_OTHER): Payer: Self-pay | Admitting: Family

## 2011-06-03 ENCOUNTER — Encounter: Payer: Self-pay | Admitting: Family

## 2011-06-03 VITALS — BP 111/67 | Temp 97.3°F | Wt 184.1 lb

## 2011-06-03 DIAGNOSIS — Z3490 Encounter for supervision of normal pregnancy, unspecified, unspecified trimester: Secondary | ICD-10-CM

## 2011-06-03 DIAGNOSIS — D649 Anemia, unspecified: Secondary | ICD-10-CM | POA: Insufficient documentation

## 2011-06-03 DIAGNOSIS — O99019 Anemia complicating pregnancy, unspecified trimester: Secondary | ICD-10-CM

## 2011-06-03 LAB — POCT URINALYSIS DIP (DEVICE)
Glucose, UA: NEGATIVE mg/dL
Nitrite: NEGATIVE
Protein, ur: 30 mg/dL — AB
Urobilinogen, UA: 0.2 mg/dL (ref 0.0–1.0)

## 2011-06-03 MED ORDER — INTEGRA F 125-1 MG PO CAPS: 1.0000 | ORAL_CAPSULE | Freq: Every day | ORAL | Status: DC

## 2011-06-03 MED ORDER — NITROFURANTOIN MONOHYD MACRO 100 MG PO CAPS: 100.0000 mg | ORAL_CAPSULE | Freq: Two times a day (BID) | ORAL | Status: AC

## 2011-06-03 NOTE — Progress Notes (Signed)
Reports approximately 2 contractions a day; denies bleeding or LOF; no UTI symptoms > RX Macrobid, send urine to culture; reviewed hgb level, reports feeling tired RX Integra.  Discussed round ligament pain.

## 2011-06-03 NOTE — Progress Notes (Signed)
Pulse 76 Patient reports pelvic and vaginal pressure that worsens with walking or lying down, states "makes it hard to sleep"; also reports occasional contractions.

## 2011-06-04 ENCOUNTER — Ambulatory Visit (HOSPITAL_COMMUNITY)
Admission: RE | Admit: 2011-06-04 | Discharge: 2011-06-04 | Disposition: A | Discharge status: Home / Self Care | Payer: Self-pay | Source: Ambulatory Visit | Attending: Family | Admitting: Family

## 2011-06-04 DIAGNOSIS — Z3689 Encounter for other specified antenatal screening: Secondary | ICD-10-CM | POA: Insufficient documentation

## 2011-06-04 DIAGNOSIS — Z3490 Encounter for supervision of normal pregnancy, unspecified, unspecified trimester: Secondary | ICD-10-CM

## 2011-06-04 DIAGNOSIS — O3660X0 Maternal care for excessive fetal growth, unspecified trimester, not applicable or unspecified: Secondary | ICD-10-CM | POA: Insufficient documentation

## 2011-06-05 ENCOUNTER — Encounter: Payer: Self-pay | Admitting: Family

## 2011-06-05 LAB — CULTURE, OB URINE

## 2011-06-17 ENCOUNTER — Ambulatory Visit (INDEPENDENT_AMBULATORY_CARE_PROVIDER_SITE_OTHER): Payer: Self-pay | Admitting: Physician Assistant

## 2011-06-17 VITALS — BP 104/72 | Temp 97.3°F | Wt 186.2 lb

## 2011-06-17 DIAGNOSIS — O99019 Anemia complicating pregnancy, unspecified trimester: Secondary | ICD-10-CM

## 2011-06-17 DIAGNOSIS — Z3490 Encounter for supervision of normal pregnancy, unspecified, unspecified trimester: Secondary | ICD-10-CM

## 2011-06-17 DIAGNOSIS — D649 Anemia, unspecified: Secondary | ICD-10-CM

## 2011-06-17 DIAGNOSIS — O4690 Antepartum hemorrhage, unspecified, unspecified trimester: Secondary | ICD-10-CM

## 2011-06-17 LAB — POCT URINALYSIS DIP (DEVICE)
Glucose, UA: NEGATIVE mg/dL
Nitrite: NEGATIVE
Specific Gravity, Urine: 1.02 (ref 1.005–1.030)
Urobilinogen, UA: 0.2 mg/dL (ref 0.0–1.0)
pH: 7 (ref 5.0–8.0)

## 2011-06-17 NOTE — Progress Notes (Signed)
Review of 3 GTT: Borderline, EFW at 32 weeks 4#13, AC> 97%, FBS today:95 Reviewed with pt and husband, likely glucose resistance. FU with Seward Grater for education and teaching on Monday. Will transfer to [facility] to monitor BS's.

## 2011-06-17 NOTE — Patient Instructions (Signed)
Diabetes mellitus gestacional (Gestational Diabetes Mellitus) La diabetes mellitus gestacional se produce slo durante el embarazo. Aparece cuando el organismo no puede controlar adecuadamente la glucosa (azcar) que aumenta en la sangre despus de comer. Durante el embarazo, se produce una resistencia a la insulina (sensibilidad reducida a la insulina) debido a la liberacin de hormonas por parte de la placenta. Generalmente, el pncreas de una mujer embarazada produce la cantidad suficiente de insulina para vencer esa resistencia. Sin embargo, en la diabetes gestacional, hay insulina pero no cumple su funcin adecuadamente. Si la resistencia es lo suficientemente grave como para que el pncreas no produzca la cantidad de insulina suficiente, la glucosa extra se acumula en la sangre.  QUINES TIENEN RIESGO DE DESARROLLAR DIABETES GESTACIONAL?  Las mujeres con historia de diabetes en la familia.   Las mujeres de ms de 25 aos.   Las que presentan sobrepeso.   Las mujeres que pertenecen a ciertos grupos tnicos (latinas, afroamericanas, norteamericanas nativas, asiticas y las originarias de las islas del [employer].  QUE PUEDE OCURRIRLE AL BEB? Si el nivel de glucosa en sangre de la madre es demasiado elevado mientras este embarazada, el nivel extra de azcar pasar por el cordn umbilical hacia el beb. Algunos de los problemas del beb pueden ser:  Beb demasiado grande: si el nio recibe demasiada azcar, puede aumentar mucho de peso. Esto puede hacer que sea demasiado grande para nacer por parto normal (vaginal) por lo que ser necesario realizar una cesrea.   Bajo nivel de glucosa (hipoglucemia): el beb produce insulina extra en respuesta a la excesiva cantidad de azcar que obtiene de la madre. Cuando el beb nace y ya no necesita insulina extra, su nivel de azcar en sangre puede disminuir.   Ictericia (coloracin amarillenta de la piel y los ojos): esto es bastante frecuente en los  bebs. La causa es la acumulacin de una sustancia qumica denominada bilirrubina. No siempre es un trastorno grave, pero se observa con frecuencia en los bebs cuyas madres sufren diabetes gestacional.  RIESGOS PARA LA MADRE Las mujeres que han sufrido diabetes gestacional pueden tener ms riesgos para algunos problemas como:  Preeclampsia o toxemia, incluyendo problemas con hipertensin arterial. La presin arterial y los niveles de protenas en la orina deben controlarse con frecuencia.   Infecciones   Parto por cesrea.   Aparicin de diabetes tipo 2 en una etapa posterior de la vida. Alrededor del 30% al 50% sufrir diabetes posteriormente, especialmente las que son obesas.  DIAGNSTICO Las hormonas que causan resistencia a la insulina tienen su mayor nivel alrededor de las 24 a 28 semanas del embarazo. Si se experimentan sntomas, stos son similares a los sntomas que normalmente aparecen durante el embarazo.  La diabetes mellitus gestacional generalmente se diagnostica por medio de un mtodo en dos partes: 1. Despus de la 24 a 28 semanas de embarazo, la mujer debe beber una solucin que contiene glucosa y realizar un anlisis de sangre. Si el nivel de glucosa es elevado, la realizarn un segundo anlisis.  2. La prueba oral de tolerancia a la glucosa, que dura aproximadamente tres horas. Despus de realizar ayuno durante la noche, se controla nivel de glucosa en sangre. La mujer bebe una solucin que contiene glucosa y le realizan anlisis de glucosa en sangre cada hora.  Si la mujer tiene factores de riesgos para la diabetes mellitus gestacional, el mdico podr indicar el anlisis antes de las 24 semanas de embarazo. TRATAMIENTO El tratamiento est dirigido a mantener la glucosa en   sangre de la madre en un nivel normal y puede incluir:  La planificacin de los alimentos.   Recibir insulina u otro medicamento para controlar el nivel de glucosa en sangre.   La prctica de ejercicios.    Llevar un registro diario de los alimentos que consume.   Control y registro de los niveles de glucosa en sangre.   Control de los niveles de cetona en la orina, aunque esto ya no se considera necesario en la mayora de los embarazos.  INSTRUCCIONES PARA EL CUIDADO DOMICILIARIO Mientras est embarazada:  Siga los consejos de su mdico relacionados con los controles prenatales, la planificacin de la comida, la actividad fsica, los medicamentos, vitaminas, los anlisis de sangre y otras pruebas y las actividades fsicas.   Lleve un registro de las comidas, las pruebas de glucosa en sangre y la cantidad de insulina que recibe (si corresponde). Muestre todo al profesional en cada consulta mdica prenatal.   Si sufre diabetes mellitus gestacional, podr tener problemas de hipoglucemia (nivel bajo de glucosa en sangre). Podr sospechar este problema si se siente repentinamente mareada, tiene temblores y/o se siente dbil. Si cree que esto le est ocurriendo, y tiene un medidor de glucosa, mida su nivel de glucosa en sangre. Siga los consejos de su mdico sobre el modo y el momento de tratar su nivel de glucosa en sangre. Generalmente se sigue la regla 15:15 Consuma 15 g de hidratos de carbono, espere 15 minutos y vuelva controlar el nivel de glucosa en sangre.. Ejemplos de 15 g de hidratos de carbono son:   1 taza de leche descremada.    taza de jugo.   3-4 tabletas de glucosa.   5-6 caramelos duros.   1 caja pequea de pasas de uva.    taza de gaseosa comn.   Mantenga una buena higiene para evitar infecciones.   No fume.  SOLICITE ATENCIN MDICA SI:  Observa prdida vaginal con o sin picazn.   Se siente ms dbil o cansada que lo habitual.   Transpira mucho.   Tiene un aumento de peso repentino, 2,5 kg o ms en una semana.   Pierde peso, 1.5 kg o ms en una semana.   Su nivel de glucosa en sangre es elevado, necesita instrucciones.  SOLICITE ATENCIN MDICA DE  INMEDIATO SI:  Sufre una cefalea intensa.   Se marea o pierde el conocimiento   Presenta nuseas o vmitos.   Se siente desorientada confundida.   Sufre convulsiones.   Tiene problemas de visin.   Siente dolor en el estmago.   Presenta una hemorragia vaginal abundante.   Tiene contracciones uterinas.   Tiene una prdida importante de lquido por la vagina  DESPUS QUE NACE EL BEB:  Concurra a todos los controles de seguimiento y realice los anlisis de sangre segn las indicaciones de su mdico.   Mantenga un estilo de vida saludable para evitar la diabetes en el futuro. Aqu se incluye:   Siga el plan de alimentacin saludable.   Controle su peso.   Practique actividad fsica y descanse lo necesario.   No fume.   Amamante a su beb mientras pueda. Esto disminuir la probabilidad de que usted y su beb sufran diabetes posteriormente.  Para ms informacin acerca de la diabetes, visite la pgina web de la American Diabetes Association: www.americandiabetesassociation.org. Para ms informacin acerca de la diabetes gestacional cite la pgina web del American Congress of Obstetricians and Gynecologists en: www.acog.org. Document Released: 10/08/2004 Document Revised: 12/18/2010 ExitCare Patient Information 2012   [employer]. 

## 2011-06-17 NOTE — Progress Notes (Signed)
Edema-feet. Pelvic pressure. No vaginal discharge. Pulse 71.

## 2011-06-22 ENCOUNTER — Encounter: Payer: Self-pay | Admitting: Family Medicine

## 2011-06-22 ENCOUNTER — Encounter: Payer: Self-pay | Attending: Family Medicine | Admitting: Dietician

## 2011-06-22 ENCOUNTER — Ambulatory Visit (INDEPENDENT_AMBULATORY_CARE_PROVIDER_SITE_OTHER): Payer: Self-pay | Admitting: Family Medicine

## 2011-06-22 VITALS — BP 112/70 | Temp 97.6°F | Wt 187.5 lb

## 2011-06-22 DIAGNOSIS — O99810 Abnormal glucose complicating pregnancy: Secondary | ICD-10-CM

## 2011-06-22 DIAGNOSIS — D649 Anemia, unspecified: Secondary | ICD-10-CM

## 2011-06-22 DIAGNOSIS — Z713 Dietary counseling and surveillance: Secondary | ICD-10-CM | POA: Insufficient documentation

## 2011-06-22 DIAGNOSIS — O99019 Anemia complicating pregnancy, unspecified trimester: Secondary | ICD-10-CM

## 2011-06-22 DIAGNOSIS — Z3490 Encounter for supervision of normal pregnancy, unspecified, unspecified trimester: Secondary | ICD-10-CM

## 2011-06-22 LAB — POCT URINALYSIS DIP (DEVICE)
Glucose, UA: NEGATIVE mg/dL
Specific Gravity, Urine: 1.02 (ref 1.005–1.030)
Urobilinogen, UA: 0.2 mg/dL (ref 0.0–1.0)
pH: 7 (ref 5.0–8.0)

## 2011-06-22 MED ORDER — RANITIDINE HCL 150 MG PO TABS: 150.0000 mg | ORAL_TABLET | Freq: Two times a day (BID) | ORAL | Status: DC

## 2011-06-22 NOTE — Progress Notes (Signed)
Diabetes Education:  Completed the review of the diet for GDM.  Provided handouts in Spanish even though he is fluent in [place].  Handout:  Nutrition, Diabetes and Pregnancy and Novo Nordisk Carb Counting Guide.  Family member present who also speaks [place] and [place].  Provided True Track Meter Lot: [id number] Exp: 2014/08/12  1 box strips Lot: [id number] Exp: 2013/10/11 and 1 box lancets Lot: [id number] Exp: 2015/04/23.  On return demonstration, her fasting glucose at 9:15 AM was 96 mg. Instructed to monitor fasting and two hours pp.  Instructed to bring meter and glucose log to each visit.  Maggie Denene Alamillo, RN, RD, CDE

## 2011-06-22 NOTE — Progress Notes (Signed)
Pulse- 67  Edema-legs/feet  Pain-lower pain  Pressure-vaginal

## 2011-06-22 NOTE — Progress Notes (Signed)
Heartburn - tums not effective.  Worse at night.  Will prescribe zantac. No other complaints.

## 2011-06-22 NOTE — Patient Instructions (Signed)
Acidez de estmago durante el embarazo  (Heartburn During Pregnancy)  La acidez es la sensacin de ardor en el pecho que se siente cuando el cido del estmago vuelve haca el esfago. La acidez (tambin llamada "reflujo") es frecuente en el embarazo debido a ciertos cambios hormonales (progesterona). La progesterona relaja la vlvula que separa el esfago del [place]. Esto hace que el cido suba al esfago y cause acidez. Tambin puede ocurrir Visual merchandiser debido a que el tero al agrandarse empuja el estmago, lo que hace que suba ms cido al esfago. Esto se produce especialmente en las ltimas etapas del embarazo. La acidez generalmente desaparece despus del parto.  CAUSAS  La hormona progesterona.   Cambios en los niveles hormonales.   El tero crece y [address] cido del estmago [place].   Comidas abundantes.   Ciertos alimentos y bebidas.   La prctica de ejercicios.   Aumento en la produccin de cido.  SNTOMAS  Dolor intenso en el pecho o la parte baja de la garganta.   Gusto amargo en la boca.   Tos.  DIAGNSTICO El mdico la diagnostica con una historia clnica cuidadosa en la que pregunta por sus molestias. Le indicar un anlisis de sangre para Clinical research associate cierto tipo de bacteria que se asocia con la [place]. En algunos casos se diagnostica recetando un medicamento para calmar la acidez y viendo si los sntomas mejoran. Durante el embarazo no es frecuente que se indique una endoscopa. En este procedimiento se [place] un tubo con [place] Dear luz y una cmara en un extremo, y se examina el esfago y Investment banker, corporate.  TRATAMIENTO  El mdico aconsejar sobre el uso de medicamentos de venta libre (anticidos, medicamentos para disminuir la Engineering geologist) en los casos de sntomas leves.   El mdico indicar medicamentos para disminuir el cido estomacal o para proteger la superficie del [place].   El profesional indicar cambios en la dieta.   En casos graves, el mdico recomendar que  eleve la cabecera de la cama con bloques. (Dormir con ms almohadas no es Manufacturing systems engineer, ya que slo modifica la posicin de la cabeza y no mejora el problema principal del reflujo cido del estmago al esfago.)  INSTRUCCIONES PARA EL CUIDADO DOMICILIARIO  Tome todos los medicamentos segn le indic su mdico.   Levante la cabecera de la cama con bloques bajo las patas.   No haga ejercicios enseguida despus de comer.   Evite comer 2  3 horas antes de ir a dormir.  No se acueste enseguida despus de comer.   Haga comidas pequeas durante Glass blower/designer de 3 comidas abundantes.   Identifique los alimentos o las bebidas que empeoran sus sntomas y evtelos. Los alimentos que debe evitar son:   Pimientos.   Chocolate.   Alimentos alto en grasas, incluidos los fritos.   Comidas muy condimentadas.   Ajo, cebolla.   Frutos ctricos, que incluyen naranjas, uvas, limones y limas.   Alimentos que contengan tomates o productos derivados del [place].   Menta.   Bebidas gaseosas y con cafena.   Vinagre.  SOLICITE ATENCIN MDICA DE INMEDIATO SI:  Tiene dolor intenso en el pecho que baja hacia el brazo o hacia la mandbula o cuello.   Se siente sudoroso, mareado o sufre un desmayo.   Falta de aire.   Vomita sangre.   Dolor o dificultad para tragar.   Materia fecal con sangre o de color negro.   Tiene acidez ms de 3 veces  por semana durante ms de 2 semanas.  ASEGRESE DE QUE:   Comprende estas instrucciones.   Controlar su enfermedad.   Solicitar ayuda de inmediato si no mejora o si empeora.  Document Released: 10/08/2004 Document Revised: 12/18/2010 [facility] Patient Information 2012 [place], [place].

## 2011-06-28 ENCOUNTER — Encounter (HOSPITAL_COMMUNITY): Payer: Self-pay | Admitting: *Deleted

## 2011-06-28 ENCOUNTER — Inpatient Hospital Stay (HOSPITAL_COMMUNITY): Payer: Self-pay

## 2011-06-28 ENCOUNTER — Inpatient Hospital Stay (HOSPITAL_COMMUNITY)
Admission: AD | Admit: 2011-06-28 | Discharge: 2011-06-28 | Disposition: A | Discharge status: Home / Self Care | Payer: Self-pay | Source: Ambulatory Visit | Attending: Obstetrics & Gynecology | Admitting: Obstetrics & Gynecology

## 2011-06-28 DIAGNOSIS — R7309 Other abnormal glucose: Secondary | ICD-10-CM | POA: Insufficient documentation

## 2011-06-28 DIAGNOSIS — O368190 Decreased fetal movements, unspecified trimester, not applicable or unspecified: Secondary | ICD-10-CM | POA: Insufficient documentation

## 2011-06-28 DIAGNOSIS — O99019 Anemia complicating pregnancy, unspecified trimester: Secondary | ICD-10-CM

## 2011-06-28 DIAGNOSIS — D649 Anemia, unspecified: Secondary | ICD-10-CM

## 2011-06-28 LAB — URINALYSIS, ROUTINE W REFLEX MICROSCOPIC
Glucose, UA: NEGATIVE mg/dL
Specific Gravity, Urine: 1.02 (ref 1.005–1.030)
pH: 6 (ref 5.0–8.0)

## 2011-06-28 LAB — URINE MICROSCOPIC-ADD ON

## 2011-06-28 NOTE — MAU Note (Signed)
Pt states she last felt the baby move about 1000 today, 1300 a little both times

## 2011-06-28 NOTE — Progress Notes (Signed)
Pt states she is a dizzy and it is heard for her to catch her breathe

## 2011-06-28 NOTE — Progress Notes (Signed)
Pt removed from the monitor and transported to ultrasound. For BP and AFI

## 2011-06-28 NOTE — MAU Provider Note (Signed)
History    CSN: [id number]  Arrival date and time: 06/28/11 1715   None  Chief Complaint  Patient presents with  . Decreased Fetal Movement   HPI Comments: Pt reports decreased fetal movement since this AM.  Noted only 2 episodes since 1000.  No bleeding, no discharge, no leakage of fluids.  Baby feels as though she has moved further down in pelvis.  Mild dyspnea and vague dizziness noted.   Endorses nausea but no vomiting.    Seen in high risk clinic due to prior bleeding and GDM eval/teaching due to one abnl value on 3 hr with periodic elevated CBGs at home.  OB History    Grav Para Term Preterm Abortions TAB SAB Ect Mult Living   3 2 2  0 0 0 0 0 0 2      Past Medical History  Diagnosis Date  . No pertinent past medical history     Past Surgical History  Procedure Date  . No past surgeries     History reviewed. No pertinent family history.  History  Substance Use Topics  . Smoking status: Never Smoker   . Smokeless tobacco: Never Used  . Alcohol Use: No    Allergies: No Known Allergies  Prescriptions prior to admission  Medication Sig Dispense Refill  . Prenatal Vit-Fe Fumarate-FA (PRENATAL MULTIVITAMIN) TABS Take 1 tablet by mouth every evening.      . ranitidine (ZANTAC) 150 MG tablet Take 1 tablet (150 mg total) by mouth 2 (two) times daily.  60 tablet  3    Review of Systems  Constitutional: Negative for fever and chills.  HENT: Negative for congestion.   Eyes: Negative for blurred vision and double vision.  Respiratory: Positive for shortness of breath. Negative for cough, hemoptysis, sputum production and wheezing.   Cardiovascular: Negative for orthopnea and leg swelling.  Gastrointestinal: Positive for nausea. Negative for vomiting, abdominal pain, diarrhea, blood in stool and melena.  Genitourinary: Negative for dysuria, urgency and frequency.  Musculoskeletal: Negative.   Skin: Negative for itching and rash.  Neurological: Positive for  dizziness. Negative for tingling, tremors, sensory change, speech change, focal weakness and headaches.  Endo/Heme/Allergies: Negative.   Psychiatric/Behavioral: Negative.    Physical Exam   Blood pressure 107/73, pulse 74, temperature 98.8 F (37.1 C), temperature source Oral, resp. rate 18, height 5\' 2"  (1.575 m), weight 85.73 kg (189 lb), last menstrual period 09/20/2010.  Physical Exam  Constitutional: She appears well-developed and well-nourished. No distress.  HENT:  Head: Normocephalic and atraumatic.  Mouth/Throat: No oropharyngeal exudate.  Eyes: Right eye exhibits no discharge. Left eye exhibits no discharge. No scleral icterus.  Neck: No thyromegaly present.  Cardiovascular: Normal rate, regular rhythm and intact distal pulses.   Respiratory: Effort normal and breath sounds normal. No respiratory distress.  GI: Soft. Bowel sounds are normal.  Genitourinary:       Fundal height 35cm  Musculoskeletal: She exhibits no edema and no tenderness.  Skin: Skin is warm and dry. No rash noted. She is not diaphoretic. No erythema. No pallor.  Psychiatric: She has a normal mood and affect. Her behavior is normal. Judgment and thought content normal.     MAU Course  Procedures  MDM Baby noted to move and had visible movement during exam Due to duration and history of High Risk Pregnancy will obtain BPP  Assessment and Plan  Pending further evaluation  Care resumed from Gaspar Bidding DO  IUP at 34.5 Decreased FM- BPP 10/10 Impaired  glucose tolerance  D/C home Keep sched appt tomorrow 6/17 which will include DM education  Cam Hai 06/28/2011, 9:35 PM

## 2011-06-28 NOTE — Discharge Instructions (Signed)
Fetal Movement Counts Patient Name: __________________________________________________ Patient Due Date: ____________________ Kick counts is highly recommended in high risk pregnancies, but it is a good idea for every pregnant woman to do. Start counting fetal movements at 28 weeks of the pregnancy. Fetal movements increase after eating a full meal or eating or drinking something sweet (the blood sugar is higher). It is also important to drink plenty of fluids (well hydrated) before doing the count. Lie on your left side because it helps with the circulation or you can sit in a comfortable chair with your arms over your belly (abdomen) with no distractions around you. DOING THE COUNT  Try to do the count the same time of day each time you do it.   Mark the day and time, then see how long it takes for you to feel 10 movements (kicks, flutters, swishes, rolls). You should have at least 10 movements within 2 hours. You will most likely feel 10 movements in much less than 2 hours. If you do not, wait an hour and count again. After a couple of days you will see a pattern.   What you are looking for is a change in the pattern or not enough counts in 2 hours. Is it taking longer in time to reach 10 movements?  SEEK MEDICAL CARE IF:  You feel less than 10 counts in 2 hours. Tried twice.   No movement in one hour.   The pattern is changing or taking longer each day to reach 10 counts in 2 hours.   You feel the baby is not moving as it usually does.  Date: ____________ Movements: ____________ Start time: ____________ Finish time: ____________  Date: ____________ Movements: ____________ Start time: ____________ Finish time: ____________ Date: ____________ Movements: ____________ Start time: ____________ Finish time: ____________ Date: ____________ Movements: ____________ Start time: ____________ Finish time: ____________ Date: ____________ Movements: ____________ Start time: ____________ Finish time:  ____________ Date: ____________ Movements: ____________ Start time: ____________ Finish time: ____________ Date: ____________ Movements: ____________ Start time: ____________ Finish time: ____________ Date: ____________ Movements: ____________ Start time: ____________ Finish time: ____________  Date: ____________ Movements: ____________ Start time: ____________ Finish time: ____________ Date: ____________ Movements: ____________ Start time: ____________ Finish time: ____________ Date: ____________ Movements: ____________ Start time: ____________ Finish time: ____________ Date: ____________ Movements: ____________ Start time: ____________ Finish time: ____________ Date: ____________ Movements: ____________ Start time: ____________ Finish time: ____________ Date: ____________ Movements: ____________ Start time: ____________ Finish time: ____________ Date: ____________ Movements: ____________ Start time: ____________ Finish time: ____________  Date: ____________ Movements: ____________ Start time: ____________ Finish time: ____________ Date: ____________ Movements: ____________ Start time: ____________ Finish time: ____________ Date: ____________ Movements: ____________ Start time: ____________ Finish time: ____________ Date: ____________ Movements: ____________ Start time: ____________ Finish time: ____________ Date: ____________ Movements: ____________ Start time: ____________ Finish time: ____________ Date: ____________ Movements: ____________ Start time: ____________ Finish time: ____________ Date: ____________ Movements: ____________ Start time: ____________ Finish time: ____________  Date: ____________ Movements: ____________ Start time: ____________ Finish time: ____________ Date: ____________ Movements: ____________ Start time: ____________ Finish time: ____________ Date: ____________ Movements: ____________ Start time: ____________ Finish time: ____________ Date: ____________ Movements:  ____________ Start time: ____________ Finish time: ____________ Date: ____________ Movements: ____________ Start time: ____________ Finish time: ____________ Date: ____________ Movements: ____________ Start time: ____________ Finish time: ____________ Date: ____________ Movements: ____________ Start time: ____________ Finish time: ____________  Date: ____________ Movements: ____________ Start time: ____________ Finish time: ____________ Date: ____________ Movements: ____________ Start time: ____________ Finish time: ____________ Date: ____________ Movements: ____________ Start time:   ____________ Finish time: ____________ Date: ____________ Movements: ____________ Start time: ____________ Finish time: ____________ Date: ____________ Movements: ____________ Start time: ____________ Finish time: ____________ Date: ____________ Movements: ____________ Start time: ____________ Finish time: ____________ Date: ____________ Movements: ____________ Start time: ____________ Finish time: ____________  Date: ____________ Movements: ____________ Start time: ____________ Finish time: ____________ Date: ____________ Movements: ____________ Start time: ____________ Finish time: ____________ Date: ____________ Movements: ____________ Start time: ____________ Finish time: ____________ Date: ____________ Movements: ____________ Start time: ____________ Finish time: ____________ Date: ____________ Movements: ____________ Start time: ____________ Finish time: ____________ Date: ____________ Movements: ____________ Start time: ____________ Finish time: ____________ Date: ____________ Movements: ____________ Start time: ____________ Finish time: ____________  Date: ____________ Movements: ____________ Start time: ____________ Finish time: ____________ Date: ____________ Movements: ____________ Start time: ____________ Finish time: ____________ Date: ____________ Movements: ____________ Start time: ____________ Finish  time: ____________ Date: ____________ Movements: ____________ Start time: ____________ Finish time: ____________ Date: ____________ Movements: ____________ Start time: ____________ Finish time: ____________ Date: ____________ Movements: ____________ Start time: ____________ Finish time: ____________ Date: ____________ Movements: ____________ Start time: ____________ Finish time: ____________  Date: ____________ Movements: ____________ Start time: ____________ Finish time: ____________ Date: ____________ Movements: ____________ Start time: ____________ Finish time: ____________ Date: ____________ Movements: ____________ Start time: ____________ Finish time: ____________ Date: ____________ Movements: ____________ Start time: ____________ Finish time: ____________ Date: ____________ Movements: ____________ Start time: ____________ Finish time: ____________ Date: ____________ Movements: ____________ Start time: ____________ Finish time: ____________ Document Released: 01/28/2006 Document Revised: 12/18/2010 Document Reviewed: 07/31/2008 [employer] Patient Information 2012 [employer] 

## 2011-06-28 NOTE — MAU Note (Signed)
Pt reports has not felt baby move much today may 2 times. Also c/o of some SOB . Needs to take a deep breath in .Denies pain, vaginal bleeding or discharge.

## 2011-06-29 ENCOUNTER — Ambulatory Visit (INDEPENDENT_AMBULATORY_CARE_PROVIDER_SITE_OTHER): Payer: Self-pay | Admitting: Obstetrics and Gynecology

## 2011-06-29 VITALS — BP 107/63 | Temp 97.2°F | Wt 189.1 lb

## 2011-06-29 DIAGNOSIS — O99019 Anemia complicating pregnancy, unspecified trimester: Secondary | ICD-10-CM

## 2011-06-29 DIAGNOSIS — D649 Anemia, unspecified: Secondary | ICD-10-CM

## 2011-06-29 DIAGNOSIS — Z3490 Encounter for supervision of normal pregnancy, unspecified, unspecified trimester: Secondary | ICD-10-CM

## 2011-06-29 LAB — POCT URINALYSIS DIP (DEVICE)
Bilirubin Urine: NEGATIVE
Glucose, UA: NEGATIVE mg/dL
Nitrite: NEGATIVE

## 2011-06-29 MED ORDER — GLYBURIDE 2.5 MG PO TABS: 2.5000 mg | ORAL_TABLET | Freq: Two times a day (BID) | ORAL | Status: DC

## 2011-06-29 NOTE — Progress Notes (Signed)
Pulse- 64  Edema- legs Pt went to MAU yesterday for decreased fetal mvmt

## 2011-06-29 NOTE — Progress Notes (Signed)
Patient doing well without complaints. CBG values all out of range fasting lowest 94; pp 130-248. Will start glyburide 2.5 mg BID. Will start twice weekly NST

## 2011-06-29 NOTE — Addendum Note (Signed)
Addended by: Catalina Antigua on: 06/29/2011 08:50 AM   Modules accepted: Orders

## 2011-06-29 NOTE — MAU Provider Note (Signed)
Medical Screening exam and patient care preformed by advanced practice provider.  Agree with the above management.  

## 2011-07-01 LAB — CULTURE, OB URINE: Colony Count: 75000

## 2011-07-02 ENCOUNTER — Ambulatory Visit (INDEPENDENT_AMBULATORY_CARE_PROVIDER_SITE_OTHER): Payer: Self-pay | Admitting: *Deleted

## 2011-07-02 VITALS — BP 118/61 | Temp 98.4°F | Wt 187.5 lb

## 2011-07-02 DIAGNOSIS — O99810 Abnormal glucose complicating pregnancy: Secondary | ICD-10-CM

## 2011-07-02 DIAGNOSIS — O24419 Gestational diabetes mellitus in pregnancy, unspecified control: Secondary | ICD-10-CM

## 2011-07-02 LAB — POCT URINALYSIS DIP (DEVICE)
Nitrite: NEGATIVE
Protein, ur: NEGATIVE mg/dL
pH: 7 (ref 5.0–8.0)

## 2011-07-02 NOTE — Progress Notes (Signed)
P=73, here for NST 

## 2011-07-06 ENCOUNTER — Encounter: Payer: Self-pay | Admitting: Family Medicine

## 2011-07-06 ENCOUNTER — Ambulatory Visit (HOSPITAL_COMMUNITY)
Admission: RE | Admit: 2011-07-06 | Discharge: 2011-07-06 | Disposition: A | Discharge status: Home / Self Care | Payer: Self-pay | Source: Ambulatory Visit | Attending: Obstetrics & Gynecology | Admitting: Obstetrics & Gynecology

## 2011-07-06 ENCOUNTER — Ambulatory Visit (INDEPENDENT_AMBULATORY_CARE_PROVIDER_SITE_OTHER): Payer: Self-pay | Admitting: Family Medicine

## 2011-07-06 VITALS — BP 115/69 | Temp 97.5°F | Wt 188.4 lb

## 2011-07-06 DIAGNOSIS — O99810 Abnormal glucose complicating pregnancy: Secondary | ICD-10-CM

## 2011-07-06 DIAGNOSIS — Z3689 Encounter for other specified antenatal screening: Secondary | ICD-10-CM | POA: Insufficient documentation

## 2011-07-06 DIAGNOSIS — O24419 Gestational diabetes mellitus in pregnancy, unspecified control: Secondary | ICD-10-CM

## 2011-07-06 LAB — POCT URINALYSIS DIP (DEVICE)
Protein, ur: NEGATIVE mg/dL
Specific Gravity, Urine: 1.02 (ref 1.005–1.030)
Urobilinogen, UA: 0.2 mg/dL (ref 0.0–1.0)

## 2011-07-06 MED ORDER — GLYBURIDE 2.5 MG PO TABS: 5.0000 mg | ORAL_TABLET | Freq: Every day | ORAL | Status: DC

## 2011-07-06 NOTE — Progress Notes (Signed)
NST reviewed and reactive. FBS 71-105 1 out of range 2 hour pp 96-206  Will increase to 5 mg in am U/s growth in 2 wks. Cultures next visit.

## 2011-07-06 NOTE — Progress Notes (Signed)
U/S scheduled July 20, 2011 at 845 am.

## 2011-07-06 NOTE — Patient Instructions (Signed)
Diabetes mellitus gestacional (Gestational Diabetes Mellitus) La diabetes mellitus gestacional se produce slo durante el embarazo. Aparece cuando el organismo no puede controlar adecuadamente la glucosa (azcar) que aumenta en la sangre despus de comer. Durante el [place], se produce una resistencia a la insulina (sensibilidad reducida a la insulina) debido a la liberacin de hormonas por parte de la placenta. Generalmente, el pncreas de una mujer embarazada produce la cantidad suficiente de insulina para vencer esa resistencia. Sin embargo, en la diabetes gestacional, hay insulina pero no cumple su funcin adecuadamente. Si la resistencia es lo suficientemente grave como para que el pncreas no produzca la cantidad de insulina suficiente, la glucosa extra se acumula en la sangre.  Devota Pace RIESGO DE DESARROLLAR DIABETES GESTACIONAL?  Las mujeres con historia de diabetes en la familia.   Las mujeres de ms de [address]   Las que presentan sobrepeso.   Las [employer] pertenecen a ciertos grupos tnicos (latinas, afroamericanas, norteamericanas nativas, asiticas y las originarias de las islas del Pacfico.  QUE PUEDE OCURRIRLE AL BEB? Si el nivel de glucosa en sangre de la madre es demasiado elevado mientras este Maish V[place], el nivel extra de azcar pasar por el cordn umbilical hacia el beb. Algunos de los problemas del beb pueden ser:  Beb demasiado grande: si el nio recibe Chief Strategy Officer, puede aumentar mucho de [place]. Esto puede hacer que sea demasiado grande para nacer por parto normal (vaginal) por lo que ser necesario realizar una cesrea.   Bajo nivel de glucosa (hipoglucemia): el beb produce insulina extra en respuesta a la excesiva cantidad de azcar que obtiene de [employer]. Cuando el beb nace y ya no necesita insulina extra, su nivel de azcar en sangre puede disminuir.   Ictericia (coloracin amarillenta de la piel y los ojos): esto es bastante frecuente en los  bebs. La causa es la acumulacin de una sustancia qumica denominada bilirrubina. No siempre es un trastorno grave, pero se observa con frecuencia en los bebs cuyas madres sufren diabetes gestacional.  RIESGOS PARA LA MADRE Las mujeres que han sufrido diabetes gestacional pueden tener ms riesgos para algunos problemas como:  Preeclampsia o toxemia, incluyendo problemas con hipertensin arterial. La presin arterial y los niveles de protenas en la orina deben controlarse con frecuencia.   Infecciones   Parto por cesrea.   Aparicin de diabetes tipo 2 en una etapa posterior de la vida. Alrededor del 30% al 50% sufrir diabetes posteriormente, especialmente las que son obesas.  DIAGNSTICO Las hormonas que causan resistencia a la insulina tienen su mayor nivel alrededor de las 24 a 28 semanas del Psychiatrist. Si se experimentan sntomas, stos son similares a los sntomas que normalmente aparecen durante el embarazo.  La diabetes mellitus gestacional generalmente se diagnostica por medio de un mtodo en dos partes: 1. Despus de la 24 a 28 semanas de Psychiatrist, la mujer debe beber una solucin que contiene glucosa y Education officer, environmental un anlisis de [place]. Si el nivel de glucosa es elevado, la realizarn un segundo Soldier.  2. La prueba oral de tolerancia a la glucosa, que dura aproximadamente tres horas. Despus de realizar ayuno durante la noche, se controla nivel de glucosa en sangre. La mujer bebe una solucin que contiene glucosa y Chief Executive Officer realizan anlisis de glucosa en sangre cada hora.  Si la mujer tiene factores de riesgos para la diabetes mellitus gestacional, el mdico podr Programme researcher, broadcasting/film/video anlisis antes de las 24 semanas de [place]. TRATAMIENTO El tratamiento est dirigido a Insurance underwriter en  sangre de la madre en un nivel normal y puede incluir:  La planificacin de los alimentos.   Recibir insulina u otro medicamento para controlar el nivel de glucosa en sangre.   La prctica de ejercicios.    Llevar un registro diario de los alimentos que consume.   Control y registro de los niveles de glucosa en sangre.   Control de los niveles de cetona en la orina, aunque esto ya no se considera necesario en la mayora de los embarazos.  INSTRUCCIONES PARA EL CUIDADO DOMICILIARIO Mientras est embarazada:  Siga los consejos de su mdico relacionados con los controles prenatales, la planificacin de la comida, la actividad fsica, los medicamentos, vitaminas, los anlisis de sangre y otras pruebas y las actividades fsicas.   Lleve un registro de las comidas, las pruebas de glucosa en sangre y la cantidad de insulina que recibe (si corresponde). Muestre todo al profesional en cada consulta mdica prenatal.   Si sufre diabetes mellitus gestacional, podr tener problemas de hipoglucemia (nivel bajo de glucosa en sangre). Podr sospechar este problema si se siente repentinamente mareada, tiene temblores y/o se siente dbil. Si cree que esto le est ocurriendo, y tiene un medidor de glucosa, mida su nivel de glucosa en sangre. Siga los consejos de su mdico sobre el modo y el momento de tratar su nivel de glucosa en sangre. Generalmente se sigue la regla 15:15 Consuma 15 g de hidratos de carbono, espere 15 minutos y vuelva controlar el nivel de glucosa en sangre.. Ejemplos de 15 g de hidratos de carbono son:   1 taza de leche descremada.    taza de jugo.   3-4 tabletas de glucosa.   5-6 caramelos duros.   1 caja pequea de pasas de uva.    taza de gaseosa comn.   Mantenga una buena higiene para evitar infecciones.   No fume.  SOLICITE ATENCIN MDICA SI:  Observa prdida vaginal con o sin picazn.   Se siente ms dbil o cansada que lo habitual.   Transpira mucho.   Tiene un aumento de peso repentino, 2,5 kg o ms en una semana.   Pierde peso, 1.5 kg o ms en una semana.   Su nivel de glucosa en sangre es elevado, necesita instrucciones.  SOLICITE ATENCIN MDICA DE  INMEDIATO SI:  Sufre una cefalea intensa.   Se marea o pierde el conocimiento   Presenta nuseas o vmitos.   Se siente desorientada confundida.   Sufre convulsiones.   Tiene problemas de visin.   Siente dolor en el estmago.   Presenta una hemorragia vaginal abundante.   Tiene contracciones uterinas.   Tiene una prdida importante de lquido por la vagina  DESPUS QUE NACE EL BEB:  Concurra a todos los controles de seguimiento y realice los anlisis de sangre segn las indicaciones de su mdico.   Mantenga un estilo de vida saludable para evitar la diabetes en el futuro. Aqu se incluye:   Siga el plan de alimentacin saludable.   Controle su peso.   Practique actividad fsica y descanse lo necesario.   No fume.   Amamante a su beb mientras pueda. Esto disminuir la probabilidad de que usted y su beb sufran diabetes posteriormente.  Para ms informacin acerca de la diabetes, visite la pgina web de la American Diabetes Association: www.americandiabetesassociation.org. Para ms informacin acerca de la diabetes gestacional cite la pgina web del American Congress of Obstetricians and Gynecologists en: www.acog.org. Document Released: 10/08/2004 Document Revised: 12/18/2010 ExitCare Patient Information 2012   [employer]. Vanetta Mulders - Systems analyst trimestre (Pregnancy - Third Trimester) El tercer trimestre del embarazo (los ltimos 3 meses) es el perodo de cambios ms rpidos que atraviesan usted y el beb. El aumento de peso es ms rpido. El beb alcanza un largo de aproximadamente 50 cm (20 pulgadas) y pesa entre 2,700 y 4,500 kg (6 a 10 libras). El beb gana ms tejido graso y ya est listo para la vida fuera del cuerpo de la [place]. Mientras estn en el interior, los bebs tienen perodos de sueo y vigilia, Warehouse manager y tienen hipo. Quizs sienta pequeas contracciones del tero. Este es el falso trabajo de [place]. Tambin se las conoce como contracciones de  [place]. Es como una prctica del parto. Los problemas ms habituales de esta etapa del embarazo incluyen mayor dificultad para respirar, hinchazn de las manos y los pies por retencin de lquidos y la necesidad de Geographical information systems officer con ms frecuencia debido a que el tero y el beb presionan sobre la vejiga.  EXAMENES PRENATALES  Durante los [employer], deber seguir realizando pruebas de [place], segn avance el [place]. Estas pruebas se realizan para controlar su salud y la del beb. Tambin se realizan anlisis de sangre para [employer] niveles de [place]. La anemia (bajo nivel de hemoglobina) es frecuente durante el embarazo. Para prevenirla, se administran hierro y vitaminas. Tambin le harn nuevas pruebas para descartar la diabetes. Podrn repetirle algunas de las [employer] hicieron previamente.   En cada visita le medirn el tamao del tero. Es para asegurarse de que el beb se desarrolla correctamente.   Tambin en cada visita la pesarn. Esto se realiza para asegurarse de que aumenta de peso al ritmo indicado y que usted y su beb evolucionan normalmente.   En algunas ocasiones se realiza una ecografa para confirmar el correcto desarrollo y evolucin del beb. Esta prueba se realiza con ondas sonoras inofensivas para el beb, de modo que el profesional pueda calcular con ms precisin la fecha del [place].   Discuta las posibilidades de la anestesia si necesita cesrea.  Algunas veces se realizan pruebas especializadas del lquido amnitico que rodea al beb. Esta prueba se denomina amniocentesis. El lquido amnitico se obtiene introduciendo una aguja en el abdomen (vientre). En ocasiones se lleva a cabo cerca del final del embarazo, si es Optician, dispensing. En este caso se realiza para asegurarse de que los pulmones del beb estn lo suficientemente maduros como para que pueda vivir fuera del tero. CAMBIOS QUE OCURREN EN EL TERCER TRIMESTRE DEL EMBARAZO Su  organismo atravesar diferentes cambios durante el embarazo que varan de Neomia Dear persona a Educational psychologist. Converse con el profesional que la asiste acerca los cambios que usted note y que la preocupen.  Durante el ltimo trimestre probablemente sienta un aumento del apetito. Es normal tener "antojos" de Development worker, community. Esto vara de Neomia Dear persona a otra y de un embarazo a Therapist, art.   Podrn aparecer las primeras estras en las caderas, abdomen y [place]. Estos son cambios normales del cuerpo durante el [place]. No existen medicamentos ni ejercicios que puedan prevenir [employer].   El estreimiento puede tratarse con un laxante o agregando fibra a su dieta. Beber grandes cantidades de lquidos, tomar fibras en forma de verduras, frutas y granos integrales es de [place].   Tambin es beneficioso practicar actividad fsica. Si ha sido una persona Engineer, mining, podr continuar con la [employer] de las actividades durante el mismo. Si ha sido [employer],  puede ser beneficioso que comience con un programa de ejercicios, como realizar caminatas. Consulte con el profesional que la asiste antes de comenzar un programa de ejercicios.   Evite el consumo de cigarrillos, el alcohol, los medicamentos no prescritos y las "drogas de la calle" durante el embarazo. Estas sustancias qumicas afectan la formacin y el desarrollo del beb. Evite estas sustancias durante todo el embarazo para asegurar el nacimiento de un beb sano.   Dolor de espalda, venas varicosas y hemorroides podran aparecer o empeorar.   Los movimientos del beb pueden ser ms bruscos y aparecer ms a menudo.   Puede que note dificultades para respirar facilmente.   El ombligo podra salrsele hacia afuera.   Puede segregar un lquido amarillento (calostro) de las mamas.   Puede segregar mucus con sangre. Esto normalmente ocurre unos pocos das a una semana antes de que comience el trabajo de parto.  INSTRUCCIONES PARA EL CUIDADO  DOMICILIARIO  La mayor parte de los cuidados que se aconsejan son los mismos que los indicados para las primeras etapas del embarazo. Es importante que concurra a todas las citas con el profesional y siga sus instrucciones con respecto a los medicamentos que deba utilizar, a la actividad fsica y a la dieta.   Durante el embarazo debe obtener nutrientes para usted y para su beb. Consuma alimentos balanceados a intervalos regulares. Elija alimentos como carne, pescado, leche y otros productos lcteos descremados, verduras, frutas, panes integrales y cereales. El profesional le informar cul es el aumento de peso ideal.   Las relaciones sexuales pueden continuarse hasta casi el final del embarazo, si no se presentan otros problemas como prdida prematura (antes de tiempo) de lquido amnitico, hemorragia vaginal o dolor abdominal (en el vientre).   Realice actividad fsica todos los das, si no tiene restricciones. Consulte con el profesional que la asiste si no sabe con certeza si determinados ejercicios son seguros. El mayor aumento de peso se produce en los dos ltimos trimestres del embarazo.   Haga reposo con frecuencia, con las piernas elevadas, o segn lo necesite para evitar los calambres y el dolor de cintura.   Use un buen sostn o como los que se usan para hacer deportes para aliviar la sensibilidad de las mamas. Tambin puede serle til si lo usa mientras duerme. Si pierde calostro, podr utilizar apsitos en el sostn.   No utilice la baera con agua caliente, baos turcos y saunas.   Colquese el cinturn de seguridad cuando conduzca. Este la proteger a usted y al beb en caso de accidente.   Evite comer carne cruda y el contacto con los utensilios y desperdicios de los gatos. Estos elementos contienen grmenes que pueden causar defectos de nacimiento en el beb.   Es fcil perder algo de orina durante el embarazo. Apretar y fortalecer los msculos de la pelvis la ayudar con este  problema. Practique detener la miccin cuando est en el bao. Estos son los mismos msculos que necesita fortalecer. Son tambin los mismos msculos que utiliza cuando trata de evitar los gases. Puede practicar apretando estos msculos diez veces, y repetir esto tres veces por da aproximadamente. Una vez que conozca qu msculos debe contraer, no realice estos ejercicios durante la miccin. Puede favorecerle una infeccin si la orina vuelve hacia atrs.   Pida ayuda si tiene necesidades econmicas, de asesoramiento o nutricionales durante el embarazo. El profesional podr ayudarla con respecto a estas necesidades, o derivarla a otros especialistas.   Practique la ida hasta el   hospital a modo de [place].   Tome clases prenatales junto con su pareja para comprender, practicar y hacer preguntas acerca del Aleen Campi de parto y el nacimiento.   Prepare la habitacin del beb.   No viaje fuera de la ciudad a menos que sea absolutamente necesario y con el consejo del mdico.   Use slo zapatos bajos sin taco para tener un mejor equilibrio y prevenir cadas.  EL CONSUMO DE MEDICAMENTOS Y DROGAS DURANTE EL EMBARAZO  Contine tomando las vitaminas apropiadas para esta etapa tal como se le indic. Las vitaminas deben contener un miligramo de cido flico y deben suplementarse con hierro. Guarde todas las vitaminas fuera del alcance de los nios. La ingestin de slo un par de vitaminas o comprimidos que contengan hierro pueden ocasionar la [employer] en un beb o en un nio pequeo.   Evite el uso de [place], inclusive los de venta [place], que no hayan sido prescritos o indicados por el profesional que la asiste. Algunos medicamentos pueden causar problemas fsicos al beb. Utilice los medicamentos de venta libre o de prescripcin para Chief Technology Officer, Environmental health practitioner o la [place], segn se lo indique el profesional que lo asiste. No utilice aspirina, ibuprofeno (Motrin, Advil, Nuprin) o naproxeno (Aleve) a menos que  el profesional la autorice.   El alcohol se asocia a cierto nmero de defectos del nacimiento, incluido el sndrome de alcoholismo fetal. Debe evitar el consumo de alcohol en cualquiera de sus formas. El cigarrillo causa nacimientos prematuros y bebs de bajo peso al nacer. Las drogas de la calle son muy nocivas para el beb y estn absolutamente prohibidas. Un beb que nace de [employer], ser adicto al nacer. Ese beb tendr los mismos sntomas de abstinencia que un adulto.   Infrmele al profesional si consume alguna droga.  SOLICITE ATENCIN MDICA SI: Tiene alguna preocupacin Academic librarian. Es mejor que llame para formular las preguntas si no puede esperar hasta la prxima visita, que sentirse preocupada por ellas.  DECISIONES ACERCA DE LA CIRCUNCISIN Usted puede saber o no cul es el sexo de su beb. Si es un varn, ste es el momento de pensar acerca de la circuncisin. La circuncisin es la extirpacin del prepucio. Esta es la piel que cubre el extremo sensible del pene. No hay un motivo mdico que lo justifique. Generalmente la decisin se toma segn lo que sea popular en ese momento, o se basa en creencias religiosas. Podr conversar estos temas con el profesional que la asiste. SOLICITE ATENCIN MDICA DE INMEDIATO SI:  La temperatura oral se eleva sin motivo por encima de 102 F (38.9 C) o segn le indique el profesional que la asiste.   Tiene una prdida de lquido por la vagina (canal de parto). Si sospecha una ruptura de las [place], tmese la temperatura y llame al profesional para informarlo sobre esto.   Observa unas pequeas manchas, una hemorragia vaginal o elimina cogulos. Avsele al profesional acerca de la cantidad y de cuntos apsitos est utilizando.   Presenta un olor desagradable en la secrecin vaginal y observa un cambio en el color, de transparente a blanco.   Ha vomitado durante ms de 24 horas.   Presenta escalofros o fiebre.   Comienza a  sentir falta de aire.   Siente ardor al Beatrix Shipper.   [place] o sube ms de 900 g (ms de 2 libras), o segn lo indicado por el profesional que la asiste. Observa que sbitamente se le [employer], las [place], [place]  pies o las piernas.   Presenta dolor abdominal. Las molestias en el ligamento redondo son Neomia Dear causa benigna (no cancerosa) frecuente de Engineer, mining abdominal durante el Psychiatrist, pero el profesional que la asiste deber evaluarlo.   Presenta dolor de cabeza intenso que no se [place].   Si no siente los movimientos del beb durante ms de tres horas. Si piensa que el beb no se mueve tanto como lo haca habitualmente, coma algo que Psychologist, clinical y [employer] lado izquierdo durante [place]. El beb debe moverse al menos 4  5 veces por hora. Comunquese inmediatamente si el beb se mueve menos que lo indicado.   Se cae, se ve involucrada en un accidente automovilstico o sufre algn tipo de traumatismo.   En su hogar hay violencia mental o fsica.  Document Released: 10/08/2004 Document Revised: 12/18/2010 [facility] Patient Information 2012 [place], [place]. Eleccin del mtodo anticonceptivo  (Contraception Choices) La anticoncepcin (control de la natalidad) es el uso de cualquier mtodo o dispositivo para Location manager. A continuacin se indican algunos de esos mtodos.  MTODOS HORMONALES   Implante anticonceptivo. Es un tubo plstico delgado que contiene la hormona progesterona. No contiene estrgenos. El mdico inserta el tubo en la parte interna del brazo. El tubo puede Geneticist, molecular durante 3 aos. Despus de los 3 aos debe retirarse. El implante impide que los ovarios liberen vulos (ovulacin), espesa el moco cervical, lo que evita que los espermatozoides ingresen al tero y hace ms delgada la membrana que cubre el interior del tero.   Inyecciones de progesterona sola. Estas inyecciones se administran cada 3 meses para evitar el embarazo. La progesterona  sinttica impide que los ovarios liberen vulos. Tambin hace que el moco cervical se espese y modifica el recubrimiento interno del tero. Esto hace ms difcil que los espermatozoides sobrevivan en el tero.   Pldoras anticonceptivas. Las pldoras anticonceptivas contienen estrgenos y Education officer, museum. Actan impidiendo que el vulo se forme en el ovario(ovulacin). Las pldoras anticonceptivas son recetadas por el mdico.Tambin se utilizan para tratar los perodos menstruales abundantes.   Minipldora. Este tipo de pldora anticonceptiva contiene slo hormona progesterona. Deben tomarse todos los [address] del mes y debe recetarlas el mdico.   Parches anticonceptivos. El parche contiene hormonas similares a las que contienen las pldoras anticonceptivas. Deben cambiarse una vez por semana y se utilizan bajo prescripcin mdica.   Anillo vaginal. Anillo vaginal contiene hormonas similares a las que contienen las pldoras anticonceptivas. Se deja colocado durante tres semanas, se lo retira durante 1 semana y luego se coloca uno nuevo. La paciente debe sentirse cmoda para insertar y retirar el anillo de la vagina.Es necesaria la receta del mdico.   Anticonceptivos de Associate Professor. Los anticonceptivos de emergencia son mtodos para evitar un embarazo despus de una relacin sexual sin proteccin. Esta pldora puede tomarse inmediatamente despus de Child psychotherapist sexuales o hasta 5 [place] de haber tenido sexo sin proteccin. Es ms efectiva si se toma poco tiempo despus. Los anticonceptivos de emergencia estn disponibles sin prescripcin mdica. Consltelo con su farmacutico. No use los anticonceptivos de emergencia como nico mtodo anticonceptivo.  MTODOS DE BARRERA   Condn masculino. Es una vaina delgada (ltex o goma) que se [place] en el pene durante el acto sexual. Deri Fuelling con espermicida para aumentar la efectividad.   Condn femenino. Es una vaina blanda y floja que se adapta suavemente a la  vagina antes de las relaciones sexuales.   Diafragma. Es Neomia Dear barrera de ltex redonda  y Casimer Bilis que debe ser ajustada por un profesional. Se inserta en la vagina, junto con un gel espermicida. Debe insertarse antes de Management consultant. Debe dejar el diafragma colocado en la vagina durante 6 a 8 horas despus de la relacin sexual.   Capuchn cervical. Es una taza de ltex o plstico, redonda y [place] que cubre el cuello del tero y debe ser ajustada por un mdico. Puede dejarlo colocado en la vagina hasta 48 horas despus de las Clinical research associate.   Esponja. Es una pieza blanda y circular de espuma de poliuretano. Contiene un espermicida. Se inserta en la vagina despus de mojarla y antes de las [employer].   Espermicidas. Los espermicidas son qumicos que matan o bloquean el esperma y no lo dejan ingresar al cuello del tero y al tero. Vienen en forma de cremas, geles, supositorios, espuma o comprimidos. No es necesario tener Emergency planning/management officer. Se insertan en la vagina con un aplicador antes de Management consultant. El proceso debe repetirse cada vez que tiene relaciones sexuales.  ANTICONCEPTIVOS INTRAUTERINOS   Dispositivo intrauterino (DIU). Es un dispositivo en forma de T que se coloca en el tero durante el perodo menstrual, para Location manager. Hay dos tipos:   DIU de cobre. Este tipo de DIU est recubierto con un alambre de cobre y se inserta dentro del tero. El cobre hace que el tero y las trompas de [place]opio produzcan un liquido que [employer] espermatozoides. Puede permanecer colocado durante 10 aos.   DIU hormonal. Este tipo de DIU contiene la hormona progestina (progesterona sinttica). La hormona espesa el moco cervical y evita que los espermatozoides ingresen al tero y tambin afina la membrana que cubre el tero para evitar la implantacin del vulo fertilizado. La hormona debilita o destruye los espermatozoides que ingresan al tero. Puede permanecer  colocado durante 5 aos.  MTODOS ANTICONCEPTIVOS PERMANENTES   Ligadura de trompas en la mujer. La ligadura de trompas en la mujer se realiza sellando, atando u obstruyendo quirrgicamente las trompas de Falopio lo que impide que el vulo descienda hacia el tero.   Esterilizacin masculina. Se realiza atando los conductos por los que pasan los espermatozoides (vasectoma).Esto impide que el esperma ingrese a la vagina durante el acto sexual. Luego del procedimiento, el hombre puede eyacular lquido (semen).  MTODOS DE PLANIFICACIN NATURAL   Planificacin familiar natural.  Consiste en no tener relaciones sexuales o usar un mtodo de barrera (condn, [place], capuchn cervical) en los [employer] la mujer podra quedar [place].   Mtodo calendario.  Consiste en el seguimiento de la duracin de cada ciclo menstrual y la identificacin de los perodos frtiles.   Mtodo de Occupational hygienist.  Consiste en evitar las relaciones sexuales durante la ovulacin.   Mtodo sintotrmico. Paramedic las relaciones sexuales en la poca en la que se est ovulando, utilizando un termmetro y tendiendo en cuenta los sntomas de la ovulacin.   Mtodo post-ovulacin. Consiste en planificar las relaciones sexuales para despus de haber ovulado.  Independientemente del tipo o mtodo anticonceptivo que usted elija, es importante que use condones para protegerse contra las enfermedades de transmisin sexual (ETS). Hable con su mdico con respecto a qu mtodo anticonceptivo es el ms apropiado para usted.  Document Released: 12/29/2004 Document Revised: 12/18/2010 [facility] Patient Information 2012 [place], [place]. Amamantar al beb (Breastfeeding) LOS BENEFICIOS DE AMAMANTAR Para el beb  La primera leche (calostro ) ayuda al mejor funcionamiento del sistema digestivo del beb.   [employer]  tiene anticuerpos que provienen de la madre y que ayudan a prevenir las infecciones en el beb.   Hay una menor  incidencia de asma, enfermedades alrgicas y SMSI (sndrome de muerte sbita nfantil).   Los nutrientes que contiene la [place] materna son mejores que las frmulas para el bibern y favorecen el desarrollo cerebral.   Los bebs amamantados sufren menos gases, clicos y constipacin.  Para la mam  La lactancia materna favorece el desarrollo de un vnculo muy especial entre la madre y el beb.   Es ms conveniente, siempre disponible a la Optician, dispensing y ms econmica que la [employer]   Consume caloras en la madre y la ayuda a perder el peso ganado durante el [place].   Favorece la contraccin del tero a su tamao normal, de manera ms rpida y [employer] las hemorragias luego del [place].   Las [employer] que amamantan tienen menor riesgo de Geophysical data processor de mama.  AMAMNTELO CON FRECUENCIA  Un beb sano, nacido a trmino, puede amamantarse con tanta frecuencia como cada hora, o espaciar las comidas cada tres horas.   Esta frecuencia variar de un beb a otro. Observe al beb cuando manifieste signos de hambre, antes que regirse por el reloj.   Amamntelo tan seguido como el beb lo solicite, o cuando usted sienta la necesidad de Paramedic sus [place].   Despierte al beb si han pasado 3  4 horas desde la ltima comida.   El amamantamiento frecuente la ayudar a producir ms [place] y a Education officer, community de Engineer, mining en los pezones e hinchazn de las [place].  LA POSICIN DEL BEB PARA AMAMANTARLO  Ya sea que se encuentre acostada o sentada, asegrese que el abdomen del beb enfrente el suyo.   Sostenga la mama con el pulgar por arriba y el resto de los dedos por debajo. Asegrese que sus dedos se encuentren lejos del pezn y de la boca del beb.   Toque suavemente los labios del beb y la mejilla ms cercana a la mama con el dedo o el pezn.   Cuando la boca del beb se abra lo suficiente, introduzca el pezn y la zona oscura que lo rodea tanto como le sea posible dentro de  la boca.   Coloque a beb cerca suyo de modo que su nariz y mejillas toquen las mamas al [employer].  LAS COMIDAS  La duracin de cada comida vara de un beb a otro y de [place] comida a [place].   El beb debe succionar alrededor [employer] o tres minutos para que le llegue [place]. Esto se denomina "bajada". Por este motivo, permita que el nio se alimente en cada mama todo lo que desee. Terminar de mamar cuando haya recibido la cantidad [place] de nutrientes.   Para detener la succin coloque su dedo en la comisura de la boca del nio y Midwife entre sus encas antes de quitarle la mama de la boca. Esto la ayudar a English as a second language teacher.  REDUCIR LA CONGESTIN DE LAS MAMAS  Durante la primera semana despus del parto, usted puede experimentar [employer]. Cuando las mamas estn congestionadas, se sienten calientes, llenas y molestas al tacto. Puede reducir la congestin si:   Lo amamanta frecuentemente, cada 2-3 horas. Las mams que [employer] pronto y con frecuencia tienen menos problemas de [place].   Coloque bolsas fras livianas entre cada [place]. Esto ayuda a Building services engineer. Envuelva las bolsas de hielo en una toalla liviana para proteger su  piel.   Aplique compresas hmedas calientes [employer] durante 5 a 10 minutos antes de amamantar al [employer]. Esto aumenta la circulacin y [place] a que la [employer].   Masajee suavemente la mama antes y Psychologist, sport and exercise.   Asegrese que el nio vaca al menos una mama antes de cambiar de lado.   Use un sacaleche para vaciar la mama si el beb se duerme o no se alimenta bien. Tambin podr [employer] con esta bomba si tiene que volver al trabajo o siente que las mamas estn congestionadas.   Evite los biberones, chupetes o complementar la alimentacin con agua o jugos en lugar de la [place].   Verifique que el beb se encuentra en la posicin correcta mientras lo alimenta.   Evite el cansancio, el estrs  y la anemia   Use un soutien que sostenga bien sus mamas y evite los que tienen aro.   Consuma una dieta balanceada y beba lquidos en cantidad.  Si sigue estas indicaciones, la congestin debe mejorar en 24 a 48 horas. Si an tiene dificultades, consulte a Barista. TENDR SUFICIENTE LECHE MI BEB? Algunas veces las madres se preocupan acerca de si sus bebs tendrn la leche suficiente. Puede asegurarse que el beb tiene la leche suficiente si:  El beb succiona y escucha que traga activamente.   El nio se alimenta al menos 8 a 12 veces en 24 horas. Alimntelo hasta que se desprenda por sus propios medios o se quede dormido en la primera mama (al menos durante 10 a 20 minutos), luego ofrzcale el otro lado.   El beb moja 5 a 6 paales descartables (6 a 8 paales de tela) en 24 horas cuando tiene 5  6 das de vida.   Tiene al menos 2-3 deposiciones todos los [employer] primeros meses. La leche materna es todo el alimento que el beb necesita. No es necesario que el nio ingiera agua o preparados de bibern. De hecho, para ayudar a que sus mamas produzcan ms [place], lo mejor es no darle al beb suplementos durante las primeras semanas.   La materia fecal debe ser blanda y [place].   El beb debe aumentar 112 a 196 g por semana.  CUDESE Cuide sus mamas del siguiente modo:  Bese o dchese diariamente.   No lave sus pezones con jabn.   Comience a amamantar del lado izquierdo en una comida y del lado derecho en la siguiente.   Notar que [employer] suministro de [place] a los 2 a 5 [address]  [address] despus del [place]. Puede sentir algunas molestias por la congestin, lo que hace que sus mamas estn duras y sensibles. La congestin disminuye en 24 a 48 horas. Mientras tanto, aplique toallas hmedas calientes durante 5 a 10 minutos antes de amamantar. Un masaje suave y la extraccin de un poco de leche antes de Museum/gallery exhibitions officer ablandarn las mamas y har ms fcil que el beb se agarre.  Use un buen sostn y seque al aire los pezones durante 10 a 15 minutos luego de cada alimentacin.   Solo utilice apsitos de algodn.   Utilice lanolina [employer] pezones luego de [place]. No necesita lavarlos luego de alimentar al [employer].  Cudese del siguiente modo:   Consuma alimentos bien balanceados y refrigerios nutritivos.   Dixie Dials, jugos de fruta y agua para Warehouse manager sed (alrededor de 8 vasos por Futures trader).   Descanse lo suficiente.   Aumente la ingesta de calcio  en la dieta (1200mg /da).   Evite los alimentos que usted nota que puedan afectar al beb.  SOLICITE ATENCIN MDICA SI:  Tiene preguntas que formular o dificultades con la alimentacin a pecho.   Necesita ayuda.   Observa una zona dura, roja y que le duele en la zona de la mama, y se acompaa de fiebre de 100.5 F (38.1 C) o ms.   El beb est muy somnoliento como para alimentarse bien o tiene problemas para dormir.   El beb moja menos de 6 paales por da, a partir de los [address] de [place].   La piel del beb o la parte blanca de sus ojos est ms amarilla de lo que estaba en el hospital.   Se siente deprimida.  Document Released: 12/29/2004 Document Revised: 12/18/2010 [facility] Patient Information 2012 [place], [place].

## 2011-07-06 NOTE — Progress Notes (Signed)
P=80, c/o pelvic pressure, edema in feet/legs ,

## 2011-07-07 ENCOUNTER — Encounter: Payer: Self-pay | Admitting: Obstetrics & Gynecology

## 2011-07-07 DIAGNOSIS — O321XX0 Maternal care for breech presentation, not applicable or unspecified: Secondary | ICD-10-CM | POA: Insufficient documentation

## 2011-07-09 ENCOUNTER — Ambulatory Visit (INDEPENDENT_AMBULATORY_CARE_PROVIDER_SITE_OTHER): Payer: Self-pay | Admitting: *Deleted

## 2011-07-09 VITALS — BP 117/61 | Temp 97.1°F | Wt 188.5 lb

## 2011-07-09 DIAGNOSIS — O99810 Abnormal glucose complicating pregnancy: Secondary | ICD-10-CM

## 2011-07-09 NOTE — Progress Notes (Signed)
NST 07/09/11 reactive 

## 2011-07-09 NOTE — Progress Notes (Signed)
P=73, here for NST

## 2011-07-13 ENCOUNTER — Telehealth (HOSPITAL_COMMUNITY): Payer: Self-pay | Admitting: *Deleted

## 2011-07-13 ENCOUNTER — Ambulatory Visit (INDEPENDENT_AMBULATORY_CARE_PROVIDER_SITE_OTHER): Payer: Self-pay | Admitting: Obstetrics and Gynecology

## 2011-07-13 VITALS — BP 113/70 | Temp 97.1°F | Wt 189.2 lb

## 2011-07-13 DIAGNOSIS — O24419 Gestational diabetes mellitus in pregnancy, unspecified control: Secondary | ICD-10-CM

## 2011-07-13 DIAGNOSIS — O99810 Abnormal glucose complicating pregnancy: Secondary | ICD-10-CM

## 2011-07-13 LAB — POCT URINALYSIS DIP (DEVICE)
Glucose, UA: NEGATIVE mg/dL
Nitrite: NEGATIVE
Urobilinogen, UA: 0.2 mg/dL (ref 0.0–1.0)

## 2011-07-13 LAB — OB RESULTS CONSOLE GBS: GBS: NEGATIVE

## 2011-07-13 NOTE — Addendum Note (Signed)
Addended by: Catalina Antigua on: 07/13/2011 10:08 AM   Modules accepted: Orders

## 2011-07-13 NOTE — Progress Notes (Signed)
P = 67     Pt reports frequent UC's x2 days.

## 2011-07-13 NOTE — Telephone Encounter (Signed)
Preadmission screen Interpreter number [phone]

## 2011-07-13 NOTE — Progress Notes (Signed)
Patient scheduled on July 15, 2011 at 8 am for an External Version. Patient advised to be NPO.

## 2011-07-13 NOTE — Progress Notes (Signed)
NST reviewed and reactive. CBG f 80-115 (1 abnormal) 2hr pp 69-244 (most out of range). Patient reports skipping several meals a day because she is sleeping a lot more. Advised against this and stressed the importance of 3 meals a day with snacks in between. Cultures done today. FM/labor precautions reviewed. Fetus in breech presentation. Discussed ECV vs cesarean section. Patient desires to try ECV which will be scheduled on Wednesday with Dr. Debroah Loop. Patient remains undecided on birth control options, contemplating BTL vs Mirena

## 2011-07-14 LAB — GC/CHLAMYDIA PROBE AMP, GENITAL: Chlamydia, DNA Probe: NEGATIVE

## 2011-07-15 ENCOUNTER — Inpatient Hospital Stay (HOSPITAL_COMMUNITY)
Admission: RE | Admit: 2011-07-15 | Discharge: 2011-07-15 | DRG: 781 | Disposition: A | Discharge status: Home / Self Care | Payer: Self-pay | Source: Ambulatory Visit | Attending: Obstetrics & Gynecology | Admitting: Obstetrics & Gynecology

## 2011-07-15 ENCOUNTER — Other Ambulatory Visit: Payer: Self-pay

## 2011-07-15 VITALS — Ht 62.0 in | Wt 188.0 lb

## 2011-07-15 DIAGNOSIS — O479 False labor, unspecified: Secondary | ICD-10-CM | POA: Diagnosis present

## 2011-07-15 DIAGNOSIS — O99810 Abnormal glucose complicating pregnancy: Secondary | ICD-10-CM

## 2011-07-15 DIAGNOSIS — O321XX0 Maternal care for breech presentation, not applicable or unspecified: Secondary | ICD-10-CM

## 2011-07-15 MED ORDER — TERBUTALINE SULFATE 1 MG/ML IJ SOLN
0.2500 mg | Freq: Once | INTRAMUSCULAR | Status: AC
  Administered 2011-07-15: 0.25 mg via SUBCUTANEOUS
  Filled 2011-07-15: qty 1

## 2011-07-15 NOTE — H&P (Signed)
  Hinata Diener is a 31 y.o. female G80P2002 with IUP at [redacted]w[redacted]d presenting for ECV. Pt states she has been having occasional contractions, no vaginal bleeding, intact membranes, with active fetal activity.    Prenatal labs and studies: ABO, Rh: --/--/A POS (12/06 1715) Antibody: NEG (03/27 0939) Rubella:   RPR: NON REAC (03/27 0939)  HBsAg: NEGATIVE (03/27 0939)  HIV: NON REACTIVE (03/27 0939)  GBS:    1 hr Glucola: Gestational diabetes  Past Medical History:  Past Medical History  Diagnosis Date  . No pertinent past medical history     Past Surgical History:  Past Surgical History  Procedure Date  . No past surgeries     Obstetrical History:  OB History    Grav Para Term Preterm Abortions TAB SAB Ect Mult Living   3 2 2  0 0 0 0 0 0 2      Gynecological History:  OB History    Grav Para Term Preterm Abortions TAB SAB Ect Mult Living   3 2 2  0 0 0 0 0 0 2      Social History:  History   Social History  . Marital Status: Married    Spouse Name: N/A    Number of Children: N/A  . Years of Education: N/A   Social History Main Topics  . Smoking status: Never Smoker   . Smokeless tobacco: Never Used  . Alcohol Use: No  . Drug Use: No  . Sexually Active: Yes   Other Topics Concern  . Not on file   Social History Narrative  . No narrative on file    Family History: No family history on file.  Medications:  Prenatal vitamins,  Current Facility-Administered Medications  Medication Dose Route Frequency Provider Last Rate Last Dose  . terbutaline (BRETHINE) injection 0.25 mg  0.25 mg Subcutaneous Once Levie Heritage, DO        Allergies: No Known Allergies  Review of Systems: - Negative  Physical Exam: Height 5\' 2"  (1.575 m), weight 85.276 kg (188 lb), last menstrual period 09/20/2010. GENERAL: Well-developed, well-nourished female in no acute distress.  LUNGS: Clear to auscultation bilaterally.  HEART: Regular rate and rhythm. ABDOMEN: Soft,  nontender, nondistended, gravid. EFW 7.5 lbs EXTREMITIES: Nontender, no edema, 2+ distal pulses.   Pertinent Labs/Studies:   Assessment : Vermelle Cammarata is a 31 y.o. G3P2002 at [redacted]w[redacted]d being admitted for ECV  Plan: Risks discussed with patient including placental abruption, ROM, contractions, need for emergent cesarean delivery.  Terbutaline .0.25mg  [place] will be given.   Josuha Fontanez JEHIEL 07/15/2011, 8:49 AM

## 2011-07-15 NOTE — Discharge Summary (Addendum)
  Patient here for ECV.  Bedside ultrasound preformed to confirm breech presentation initially.  Patient was premedicated with terbutaline and monitored intermittently through the procedure.  Fetus rotated in counterclockwise direction successfully.  Patient will monitored for 1 hr post-procedure, then discharged to home.  No changes in medication.  Patient to followup on Monday in high risk clinic.  Dr Debroah Loop was present during the procedure.  Agree with the above note, procedure without complications.  Joanne Roberts 07/15/2011 9:22 AM

## 2011-07-20 ENCOUNTER — Ambulatory Visit (INDEPENDENT_AMBULATORY_CARE_PROVIDER_SITE_OTHER): Payer: Self-pay | Admitting: Obstetrics and Gynecology

## 2011-07-20 ENCOUNTER — Ambulatory Visit (HOSPITAL_COMMUNITY)
Admission: RE | Admit: 2011-07-20 | Discharge: 2011-07-20 | Disposition: A | Discharge status: Home / Self Care | Payer: Self-pay | Source: Ambulatory Visit | Attending: Family Medicine | Admitting: Family Medicine

## 2011-07-20 VITALS — BP 115/68 | Wt 190.5 lb

## 2011-07-20 DIAGNOSIS — Z3689 Encounter for other specified antenatal screening: Secondary | ICD-10-CM | POA: Insufficient documentation

## 2011-07-20 DIAGNOSIS — O3660X0 Maternal care for excessive fetal growth, unspecified trimester, not applicable or unspecified: Secondary | ICD-10-CM | POA: Insufficient documentation

## 2011-07-20 DIAGNOSIS — O99810 Abnormal glucose complicating pregnancy: Secondary | ICD-10-CM

## 2011-07-20 DIAGNOSIS — Z3490 Encounter for supervision of normal pregnancy, unspecified, unspecified trimester: Secondary | ICD-10-CM

## 2011-07-20 LAB — POCT URINALYSIS DIP (DEVICE)
Ketones, ur: NEGATIVE mg/dL
Protein, ur: 30 mg/dL — AB
pH: 6.5 (ref 5.0–8.0)

## 2011-07-20 NOTE — Progress Notes (Signed)
P = 69    Pt had successful external cephalic version on 07/15/11- remains vtx presentation today by informal [place] @ bedside. .  [place] growth today @ 0845.

## 2011-07-20 NOTE — Addendum Note (Signed)
Addended by: Catalina Antigua on: 07/20/2011 08:53 AM   Modules accepted: Level of Service

## 2011-07-20 NOTE — Progress Notes (Signed)
Patient doing well without complaints. CBG's fasting 80-91; 2 hr pp 72-168 (4/19 abnormal). F/U growth ultrasound today. Will schedule induction of labor on 7/16 NST reviewed and reactive

## 2011-07-23 ENCOUNTER — Inpatient Hospital Stay (HOSPITAL_COMMUNITY)
Admission: AD | Admit: 2011-07-23 | Discharge: 2011-07-23 | Disposition: A | Discharge status: Hospice | Payer: Self-pay | Source: Ambulatory Visit | Attending: Obstetrics & Gynecology | Admitting: Obstetrics & Gynecology

## 2011-07-23 ENCOUNTER — Encounter (HOSPITAL_COMMUNITY): Payer: Self-pay | Admitting: *Deleted

## 2011-07-23 DIAGNOSIS — O321XX0 Maternal care for breech presentation, not applicable or unspecified: Secondary | ICD-10-CM

## 2011-07-23 DIAGNOSIS — O99019 Anemia complicating pregnancy, unspecified trimester: Secondary | ICD-10-CM

## 2011-07-23 DIAGNOSIS — O479 False labor, unspecified: Secondary | ICD-10-CM | POA: Insufficient documentation

## 2011-07-23 DIAGNOSIS — O24419 Gestational diabetes mellitus in pregnancy, unspecified control: Secondary | ICD-10-CM

## 2011-07-23 HISTORY — DX: Anemia, unspecified: D64.9

## 2011-07-23 NOTE — MAU Provider Note (Signed)
I agree with the above. Cam Hai 7:26 AM 07/23/2011

## 2011-07-23 NOTE — MAU Note (Signed)
Pt started with contractions Q 5 -10 min apart @ 2300.

## 2011-07-23 NOTE — MAU Note (Signed)
contractions 

## 2011-07-23 NOTE — MAU Provider Note (Signed)
History   CSN: [id number]  Arrival date and time: 07/23/11 0301   First Provider Initiated Contact with Patient 07/23/11 0408     Chief Complaint  Patient presents with  . Labor Eval   HPI: [name] is a G3P2 at 38 weeks and 2 days gestation by 7-week ultrasound who started feeling contractions at 11pm last night.  They were fairly intense and somewhat regular, so she decided to come in.  She denies vaginal bleeding and LOF and reports that her baby girl is moving around well, especially with contractions.    She has gestational diabetes and anemia.  She does not check her sugar QID (more like QOD to BID), but says that her morning sugars have been good while her post-prandial are still sometimes 200 or above.  She is taking her glyburide.  She had an ECV 7 days ago and the ultrasound 3 days ago showed that she was still head-down.  OB History    Grav Para Term Preterm Abortions TAB SAB Ect Mult Living   3 2 2  0 0 0 0 0 0 2      Past Medical History  Diagnosis Date  . Diabetes mellitus    Bleeding during 1st trimester of current pregnancy - resolved   . Anemia     Past Surgical History  Procedure Date  . No past surgeries     Family History  Problem Relation Age of Onset  . Diabetes Father     History  Substance Use Topics  . Smoking status: Never Smoker   . Smokeless tobacco: Never Used  . Alcohol Use: No    Allergies: No Known Allergies  Prescriptions prior to admission  Medication Sig Dispense Refill  . glyBURIDE (DIABETA) 2.5 MG tablet Take 2 tablets (5 mg total) by mouth daily with breakfast. Take 1 po q hs for 5mg  qam, 2.5 mg qhs.  90 tablet  3  . Prenatal Vit-Fe Fumarate-FA (PRENATAL MULTIVITAMIN) TABS Take 1 tablet by mouth every evening.      . ranitidine (ZANTAC) 150 MG tablet Take 1 tablet (150 mg total) by mouth 2 (two) times daily.  60 tablet  3    Review of Systems  Constitutional: Negative for fever and chills.  Eyes: Negative for blurred  vision.  Respiratory: Negative for cough and shortness of breath.   Cardiovascular: Negative for chest pain.  Gastrointestinal: Positive for heartburn. Negative for vomiting and diarrhea.  Genitourinary: Positive for frequency. Negative for dysuria.  Skin: Negative for itching and rash.  Neurological: Negative for dizziness and headaches.   Physical Exam   Blood pressure 105/56, pulse 59, temperature 98.2 F (36.8 C), temperature source Oral, resp. rate 18, height 5\' 2"  (1.575 m), weight 86.637 kg (191 lb), last menstrual period 09/20/2010, SpO2 100.00%.  Physical Exam  Constitutional: She is oriented to person, place, and time. She appears well-developed and well-nourished. No distress.  HENT:  Head: Normocephalic and atraumatic.  Eyes: Conjunctivae are normal.  Neck: Normal range of motion. Neck supple.  Cardiovascular: Regular rhythm.   Respiratory: Effort normal.  GI: Soft.  Genitourinary: Vagina normal.  Neurological: She is alert and oriented to person, place, and time.  Skin: She is not diaphoretic.  Psychiatric: She has a normal mood and affect. Her behavior is normal.   Cervix: 1.5cm dilated, 50-60% effaced, baby high and ballotable. Vertex presentation by external maneuvers.  Looks somewhat uncomfortable during some contractions, but not all.  FHR: Baseline = 120 with moderate baseline  variability and multiple accelerations the entire time she was here.  No decelerations noted.  Tocometry: Irregular uterine contractions, q3-10 minutes.  MAU Course  Procedures  FHT is Category I  Assessment and Plan  1. Contractions without cervical change: No sign of active labor given irregularity of contractions and current cervical exam (which was unchanged from the nurse's exam one hour prior).  We discussed that she could go into active labor at any time, but that it was safe to discharge her home now with the understanding that any increased regularity or strength of  contractions, decreased FM, LOF, or vaginal bleeding would prompt her to return to the MAU.  She and her husband voiced understanding.  They have an appt for an NST and a blood sugar review on 07/24/11.  I encouraged her to keep a close eye on her sugars.  Case discussed with Philipp Deputy, CNM  Junious Silk S 07/23/2011, 4:40 AM

## 2011-07-24 ENCOUNTER — Ambulatory Visit (INDEPENDENT_AMBULATORY_CARE_PROVIDER_SITE_OTHER): Payer: Self-pay | Admitting: *Deleted

## 2011-07-24 VITALS — BP 116/54 | Wt 189.0 lb

## 2011-07-24 DIAGNOSIS — O99810 Abnormal glucose complicating pregnancy: Secondary | ICD-10-CM

## 2011-07-24 NOTE — Progress Notes (Signed)
P = 54   IOL scheduled 07/29/11 @ 0630

## 2011-07-27 ENCOUNTER — Ambulatory Visit (INDEPENDENT_AMBULATORY_CARE_PROVIDER_SITE_OTHER): Payer: Self-pay | Admitting: Family Medicine

## 2011-07-27 ENCOUNTER — Telehealth (HOSPITAL_COMMUNITY): Payer: Self-pay | Admitting: *Deleted

## 2011-07-27 VITALS — BP 115/63 | Temp 97.1°F | Wt 188.2 lb

## 2011-07-27 DIAGNOSIS — O99810 Abnormal glucose complicating pregnancy: Secondary | ICD-10-CM

## 2011-07-27 DIAGNOSIS — O4690 Antepartum hemorrhage, unspecified, unspecified trimester: Secondary | ICD-10-CM

## 2011-07-27 LAB — POCT URINALYSIS DIP (DEVICE)
Protein, ur: 100 mg/dL — AB
Specific Gravity, Urine: 1.02 (ref 1.005–1.030)
Urobilinogen, UA: 0.2 mg/dL (ref 0.0–1.0)
pH: 7 (ref 5.0–8.0)

## 2011-07-27 NOTE — Progress Notes (Signed)
Pulse: 56 Has blood tinged discharge.

## 2011-07-27 NOTE — Telephone Encounter (Signed)
Preadmission screen Translator number [phone]

## 2011-07-27 NOTE — Telephone Encounter (Signed)
Preadmission screen  

## 2011-07-27 NOTE — Progress Notes (Signed)
Blood in urine--check culture FBS 70-92 2hr pp 60-182, most in range U/S show 8 lb 10 oz--AC ahead.  Biggest baby was 8 lb 12 oz--required episiotomy for head only--no memory of shoulder problems

## 2011-07-27 NOTE — Patient Instructions (Signed)
Diabetes mellitus gestacional (Gestational Diabetes Mellitus) La diabetes mellitus gestacional se produce slo durante el embarazo. Aparece cuando el organismo no puede controlar adecuadamente la glucosa (azcar) que aumenta en la sangre despus de comer. Durante el [place], se produce una resistencia a la insulina (sensibilidad reducida a la insulina) debido a la liberacin de hormonas por parte de la placenta. Generalmente, el pncreas de una mujer embarazada produce la cantidad suficiente de insulina para vencer esa resistencia. Sin embargo, en la diabetes gestacional, hay insulina pero no cumple su funcin adecuadamente. Si la resistencia es lo suficientemente grave como para que el pncreas no produzca la cantidad de insulina suficiente, la glucosa extra se acumula en la sangre.  Devota Pace RIESGO DE DESARROLLAR DIABETES GESTACIONAL?  Las mujeres con historia de diabetes en la familia.   Las mujeres de ms de [address]   Las que presentan sobrepeso.   Las [employer] pertenecen a ciertos grupos tnicos (latinas, afroamericanas, norteamericanas nativas, asiticas y las originarias de las islas del Pacfico.  QUE PUEDE OCURRIRLE AL BEB? Si el nivel de glucosa en sangre de la madre es demasiado elevado mientras este Maish Vaya, el nivel extra de azcar pasar por el cordn umbilical hacia el beb. Algunos de los problemas del beb pueden ser:  Beb demasiado grande: si el nio recibe Chief Strategy Officer, puede aumentar mucho de [place]. Esto puede hacer que sea demasiado grande para nacer por parto normal (vaginal) por lo que ser necesario realizar una cesrea.   Bajo nivel de glucosa (hipoglucemia): el beb produce insulina extra en respuesta a la excesiva cantidad de azcar que obtiene de [employer]. Cuando el beb nace y ya no necesita insulina extra, su nivel de azcar en sangre puede disminuir.   Ictericia (coloracin amarillenta de la piel y los ojos): esto es bastante frecuente en los  bebs. La causa es la acumulacin de una sustancia qumica denominada bilirrubina. No siempre es un trastorno grave, pero se observa con frecuencia en los bebs cuyas madres sufren diabetes gestacional.  RIESGOS PARA LA MADRE Las mujeres que han sufrido diabetes gestacional pueden tener ms riesgos para algunos problemas como:  Preeclampsia o toxemia, incluyendo problemas con hipertensin arterial. La presin arterial y los niveles de protenas en la orina deben controlarse con frecuencia.   Infecciones   Parto por cesrea.   Aparicin de diabetes tipo 2 en una etapa posterior de la vida. Alrededor del 30% al 50% sufrir diabetes posteriormente, especialmente las que son obesas.  DIAGNSTICO Las hormonas que causan resistencia a la insulina tienen su mayor nivel alrededor de las 24 a 28 semanas del Psychiatrist. Si se experimentan sntomas, stos son similares a los sntomas que normalmente aparecen durante el embarazo.  La diabetes mellitus gestacional generalmente se diagnostica por medio de un mtodo en dos partes: 1. Despus de la 24 a 28 semanas de Psychiatrist, la mujer debe beber una solucin que contiene glucosa y Education officer, environmental un anlisis de [place]. Si el nivel de glucosa es elevado, la realizarn un segundo Soldier.  2. La prueba oral de tolerancia a la glucosa, que dura aproximadamente tres horas. Despus de realizar ayuno durante la noche, se controla nivel de glucosa en sangre. La mujer bebe una solucin que contiene glucosa y Chief Executive Officer realizan anlisis de glucosa en sangre cada hora.  Si la mujer tiene factores de riesgos para la diabetes mellitus gestacional, el mdico podr Programme researcher, broadcasting/film/video anlisis antes de las 24 semanas de [place]. TRATAMIENTO El tratamiento est dirigido a Insurance underwriter en  sangre de la madre en un nivel normal y puede incluir:  La planificacin de los alimentos.   Recibir insulina u otro medicamento para controlar el nivel de glucosa en sangre.   La prctica de ejercicios.    Llevar un registro diario de los alimentos que consume.   Control y registro de los niveles de glucosa en sangre.   Control de los niveles de cetona en la orina, aunque esto ya no se considera necesario en la mayora de los embarazos.  INSTRUCCIONES PARA EL CUIDADO DOMICILIARIO Mientras est embarazada:  Siga los consejos de su mdico relacionados con los controles prenatales, la planificacin de la comida, la actividad fsica, los medicamentos, vitaminas, los anlisis de sangre y otras pruebas y las actividades fsicas.   Lleve un registro de las comidas, las pruebas de glucosa en sangre y la cantidad de insulina que recibe (si corresponde). Muestre todo al profesional en cada consulta mdica prenatal.   Si sufre diabetes mellitus gestacional, podr tener problemas de hipoglucemia (nivel bajo de glucosa en sangre). Podr sospechar este problema si se siente repentinamente mareada, tiene temblores y/o se siente dbil. Si cree que esto le est ocurriendo, y tiene un medidor de glucosa, mida su nivel de glucosa en sangre. Siga los consejos de su mdico sobre el modo y el momento de tratar su nivel de glucosa en sangre. Generalmente se sigue la regla 15:15 Consuma 15 g de hidratos de carbono, espere 15 minutos y vuelva controlar el nivel de glucosa en sangre.. Ejemplos de 15 g de hidratos de carbono son:   1 taza de leche descremada.    taza de jugo.   3-4 tabletas de glucosa.   5-6 caramelos duros.   1 caja pequea de pasas de uva.    taza de gaseosa comn.   Mantenga una buena higiene para evitar infecciones.   No fume.  SOLICITE ATENCIN MDICA SI:  Observa prdida vaginal con o sin picazn.   Se siente ms dbil o cansada que lo habitual.   Transpira mucho.   Tiene un aumento de peso repentino, 2,5 kg o ms en una semana.   Pierde peso, 1.5 kg o ms en una semana.   Su nivel de glucosa en sangre es elevado, necesita instrucciones.  SOLICITE ATENCIN MDICA DE  INMEDIATO SI:  Sufre una cefalea intensa.   Se marea o pierde el conocimiento   Presenta nuseas o vmitos.   Se siente desorientada confundida.   Sufre convulsiones.   Tiene problemas de visin.   Siente dolor en el estmago.   Presenta una hemorragia vaginal abundante.   Tiene contracciones uterinas.   Tiene una prdida importante de lquido por la vagina  DESPUS QUE NACE EL BEB:  Concurra a todos los controles de seguimiento y realice los anlisis de sangre segn las indicaciones de su mdico.   Mantenga un estilo de vida saludable para evitar la diabetes en el futuro. Aqu se incluye:   Siga el plan de alimentacin saludable.   Controle su peso.   Practique actividad fsica y descanse lo necesario.   No fume.   Amamante a su beb mientras pueda. Esto disminuir la probabilidad de que usted y su beb sufran diabetes posteriormente.  Para ms informacin acerca de la diabetes, visite la pgina web de la American Diabetes Association: www.americandiabetesassociation.org. Para ms informacin acerca de la diabetes gestacional cite la pgina web del American Congress of Obstetricians and Gynecologists en: www.acog.org. Document Released: 10/08/2004 Document Revised: 12/18/2010 ExitCare Patient Information 2012   ExitCare[employer]. Vanetta Mulders - Systems analyst trimestre (Pregnancy - Third Trimester) El tercer trimestre del embarazo (los ltimos 3 meses) es el perodo de cambios ms rpidos que atraviesan usted y el beb. El aumento de peso es ms rpido. El beb alcanza un largo de aproximadamente 50 cm (20 pulgadas) y pesa entre 2,700 y 4,500 kg (6 a 10 libras). El beb gana ms tejido graso y ya est listo para la vida fuera del cuerpo de la [place]. Mientras estn en el interior, los bebs tienen perodos de sueo y vigilia, Warehouse manager y tienen hipo. Quizs sienta pequeas contracciones del tero. Este es el falso trabajo de [place]. Tambin se las conoce como contracciones de  [place]. Es como una prctica del parto. Los problemas ms habituales de esta etapa del embarazo incluyen mayor dificultad para respirar, hinchazn de las manos y los pies por retencin de lquidos y la necesidad de Geographical information systems officer con ms frecuencia debido a que el tero y el beb presionan sobre la vejiga.  EXAMENES PRENATALES  Durante los [employer], deber seguir realizando pruebas de [place], segn avance el [place]. Estas pruebas se realizan para controlar su salud y la del beb. Tambin se realizan anlisis de sangre para [employer] niveles de [place]. La anemia (bajo nivel de hemoglobina) es frecuente durante el embarazo. Para prevenirla, se administran hierro y vitaminas. Tambin le harn nuevas pruebas para descartar la diabetes. Podrn repetirle algunas de las [employer]ov[employer] Enterprises hicieron previamente.   En cada visita le medirn el tamao del tero. Es para asegurarse de que el beb se desarrolla correctamente.   Tambin en cada visita la pesarn. Esto se realiza para asegurarse de que aumenta de peso al ritmo indicado y que usted y su beb evolucionan normalmente.   En algunas ocasiones se realiza una ecografa para confirmar el correcto desarrollo y evolucin del beb. Esta prueba se realiza con ondas sonoras inofensivas para el beb, de modo que el profesional pueda calcular con ms precisin la fecha del [place].   Discuta las posibilidades de la anestesia si necesita cesrea.  Algunas veces se realizan pruebas especializadas del lquido amnitico que rodea al beb. Esta prueba se denomina amniocentesis. El lquido amnitico se obtiene introduciendo una aguja en el abdomen (vientre). En ocasiones se lleva a cabo cerca del final del embarazo, si es Optician, dispensing. En este caso se realiza para asegurarse de que los pulmones del beb estn lo suficientemente maduros como para que pueda vivir fuera del tero. CAMBIOS QUE OCURREN EN EL TERCER TRIMESTRE DEL EMBARAZO Su  organismo atravesar diferentes cambios durante el embarazo que varan de Neomia Dear persona a Educational psychologist. Converse con el profesional que la asiste acerca los cambios que usted note y que la preocupen.  Durante el ltimo trimestre probablemente sienta un aumento del apetito. Es normal tener "antojos" de Development worker, community. Esto vara de Neomia Dear persona a otra y de un embarazo a Therapist, art.   Podrn aparecer las primeras estras en las caderas, abdomen y [place]. Estos son cambios normales del cuerpo durante el [place]. No existen medicamentos ni ejercicios que puedan prevenir [employer].   El estreimiento puede tratarse con un laxante o agregando fibra a su dieta. Beber grandes cantidades de lquidos, tomar fibras en forma de verduras, frutas y granos integrales es de [place].   Tambin es beneficioso practicar actividad fsica. Si ha sido una persona Engineer, mining, podr continuar con la Harley[employer]son de las actividades durante el mismo. Si ha sido [employer],  puede ser beneficioso que comience con un programa de ejercicios, como realizar caminatas. Consulte con el profesional que la asiste antes de comenzar un programa de ejercicios.   Evite el consumo de cigarrillos, el alcohol, los medicamentos no prescritos y las "drogas de la calle" durante el embarazo. Estas sustancias qumicas afectan la formacin y el desarrollo del beb. Evite estas sustancias durante todo el embarazo para asegurar el nacimiento de un beb sano.   Dolor de espalda, venas varicosas y hemorroides podran aparecer o empeorar.   Los movimientos del beb pueden ser ms bruscos y aparecer ms a menudo.   Puede que note dificultades para respirar facilmente.   El ombligo podra salrsele hacia afuera.   Puede segregar un lquido amarillento (calostro) de las mamas.   Puede segregar mucus con sangre. Esto normalmente ocurre unos pocos das a una semana antes de que comience el trabajo de parto.  INSTRUCCIONES PARA EL CUIDADO  DOMICILIARIO  La mayor parte de los cuidados que se aconsejan son los mismos que los indicados para las primeras etapas del embarazo. Es importante que concurra a todas las citas con el profesional y siga sus instrucciones con respecto a los medicamentos que deba utilizar, a la actividad fsica y a la dieta.   Durante el embarazo debe obtener nutrientes para usted y para su beb. Consuma alimentos balanceados a intervalos regulares. Elija alimentos como carne, pescado, leche y otros productos lcteos descremados, verduras, frutas, panes integrales y cereales. El profesional le informar cul es el aumento de peso ideal.   Las relaciones sexuales pueden continuarse hasta casi el final del embarazo, si no se presentan otros problemas como prdida prematura (antes de tiempo) de lquido amnitico, hemorragia vaginal o dolor abdominal (en el vientre).   Realice actividad fsica todos los das, si no tiene restricciones. Consulte con el profesional que la asiste si no sabe con certeza si determinados ejercicios son seguros. El mayor aumento de peso se produce en los dos ltimos trimestres del embarazo.   Haga reposo con frecuencia, con las piernas elevadas, o segn lo necesite para evitar los calambres y el dolor de cintura.   Use un buen sostn o como los que se usan para hacer deportes para aliviar la sensibilidad de las mamas. Tambin puede serle til si lo usa mientras duerme. Si pierde calostro, podr utilizar apsitos en el sostn.   No utilice la baera con agua caliente, baos turcos y saunas.   Colquese el cinturn de seguridad cuando conduzca. Este la proteger a usted y al beb en caso de accidente.   Evite comer carne cruda y el contacto con los utensilios y desperdicios de los gatos. Estos elementos contienen grmenes que pueden causar defectos de nacimiento en el beb.   Es fcil perder algo de orina durante el embarazo. Apretar y fortalecer los msculos de la pelvis la ayudar con este  problema. Practique detener la miccin cuando est en el bao. Estos son los mismos msculos que necesita fortalecer. Son tambin los mismos msculos que utiliza cuando trata de evitar los gases. Puede practicar apretando estos msculos diez veces, y repetir esto tres veces por da aproximadamente. Una vez que conozca qu msculos debe contraer, no realice estos ejercicios durante la miccin. Puede favorecerle una infeccin si la orina vuelve hacia atrs.   Pida ayuda si tiene necesidades econmicas, de asesoramiento o nutricionales durante el embarazo. El profesional podr ayudarla con respecto a estas necesidades, o derivarla a otros especialistas.   Practique la ida hasta el   hospital a modo de [place].   Tome clases prenatales junto con su pareja para comprender, practicar y hacer preguntas acerca del Aleen Campi de parto y el nacimiento.   Prepare la habitacin del beb.   No viaje fuera de la ciudad a menos que sea absolutamente necesario y con el consejo del mdico.   Use slo zapatos bajos sin taco para tener un mejor equilibrio y prevenir cadas.  EL CONSUMO DE MEDICAMENTOS Y DROGAS DURANTE EL EMBARAZO  Contine tomando las vitaminas apropiadas para esta etapa tal como se le indic. Las vitaminas deben contener un miligramo de cido flico y deben suplementarse con hierro. Guarde todas las vitaminas fuera del alcance de los nios. La ingestin de slo un par de vitaminas o comprimidos que contengan hierro pueden ocasionar la [employer] en un beb o en un nio pequeo.   Evite el uso de [place], inclusive los de venta [place], que no hayan sido prescritos o indicados por el profesional que la asiste. Algunos medicamentos pueden causar problemas fsicos al beb. Utilice los medicamentos de venta libre o de prescripcin para Chief Technology Officer, Environmental health practitioner o la [place], segn se lo indique el profesional que lo asiste. No utilice aspirina, ibuprofeno (Motrin, Advil, Nuprin) o naproxeno (Aleve) a menos que  el profesional la autorice.   El alcohol se asocia a cierto nmero de defectos del nacimiento, incluido el sndrome de alcoholismo fetal. Debe evitar el consumo de alcohol en cualquiera de sus formas. El cigarrillo causa nacimientos prematuros y bebs de bajo peso al nacer. Las drogas de la calle son muy nocivas para el beb y estn absolutamente prohibidas. Un beb que nace de [employer], ser adicto al nacer. Ese beb tendr los mismos sntomas de abstinencia que un adulto.   Infrmele al profesional si consume alguna droga.  SOLICITE ATENCIN MDICA SI: Tiene alguna preocupacin Academic librarian. Es mejor que llame para formular las preguntas si no puede esperar hasta la prxima visita, que sentirse preocupada por ellas.  DECISIONES ACERCA DE LA CIRCUNCISIN Usted puede saber o no cul es el sexo de su beb. Si es un varn, ste es el momento de pensar acerca de la circuncisin. La circuncisin es la extirpacin del prepucio. Esta es la piel que cubre el extremo sensible del pene. No hay un motivo mdico que lo justifique. Generalmente la decisin se toma segn lo que sea popular en ese momento, o se basa en creencias religiosas. Podr conversar estos temas con el profesional que la asiste. SOLICITE ATENCIN MDICA DE INMEDIATO SI:  La temperatura oral se eleva sin motivo por encima de 102 F (38.9 C) o segn le indique el profesional que la asiste.   Tiene una prdida de lquido por la vagina (canal de parto). Si sospecha una ruptura de las [place], tmese la temperatura y llame al profesional para informarlo sobre esto.   Observa unas pequeas manchas, una hemorragia vaginal o elimina cogulos. Avsele al profesional acerca de la cantidad y de cuntos apsitos est utilizando.   Presenta un olor desagradable en la secrecin vaginal y observa un cambio en el color, de transparente a blanco.   Ha vomitado durante ms de 24 horas.   Presenta escalofros o fiebre.   Comienza a  sentir falta de aire.   Siente ardor al Beatrix Shipper.   [place] o sube ms de 900 g (ms de 2 libras), o segn lo indicado por el profesional que la asiste. Observa que sbitamente se le [employer], las [place], [place]  pies o las piernas.   Presenta dolor abdominal. Las molestias en el ligamento redondo son Neomia Dear causa benigna (no cancerosa) frecuente de Engineer, mining abdominal durante el Psychiatrist, pero el profesional que la asiste deber evaluarlo.   Presenta dolor de cabeza intenso que no se [place].   Si no siente los movimientos del beb durante ms de tres horas. Si piensa que el beb no se mueve tanto como lo haca habitualmente, coma algo que Psychologist, clinical y [employer] Corporation lado izquierdo durante [place]. El beb debe moverse al menos 4  5 veces por hora. Comunquese inmediatamente si el beb se mueve menos que lo indicado.   Se cae, se ve involucrada en un accidente automovilstico o sufre algn tipo de traumatismo.   En su hogar hay violencia mental o fsica.  Document Released: 10/08/2004 Document Revised: 12/18/2010 [facility] Patient Information 2012 [place], [place]. Amamantar al beb (Breastfeeding) LOS BENEFICIOS DE AMAMANTAR Para el beb  La primera leche (calostro ) ayuda al mejor funcionamiento del sistema digestivo del beb.   La leche tiene anticuerpos que provienen de la madre y que ayudan a prevenir las infecciones en el beb.   Hay una menor incidencia de asma, enfermedades alrgicas y SMSI (sndrome de muerte sbita nfantil).   Los nutrientes que contiene la [place] materna son mejores que las frmulas para el bibern y favorecen el desarrollo cerebral.   Los bebs amamantados sufren menos gases, clicos y constipacin.  Para la mam  La lactancia materna favorece el desarrollo de un vnculo muy especial entre la madre y el beb.   Es ms conveniente, siempre disponible a la Optician, dispensing y ms econmica que la [employer].   Consume caloras en la madre y  la ayuda a perder el peso ganado durante el [place].   Favorece la contraccin del tero a su tamao normal, de manera ms rpida y [employer] las hemorragias luego del [place].   Las [employer] Holdings que amamantan tienen menor riesgo de Geophysical data processor de mama.  AMAMNTELO CON FRECUENCIA  Un beb sano, nacido a trmino, puede amamantarse con tanta frecuencia como cada hora, o espaciar las comidas cada tres horas.   Esta frecuencia variar de un beb a otro. Observe al beb cuando manifieste signos de hambre, antes que regirse por el reloj.   Amamntelo tan seguido como el beb lo solicite, o cuando usted sienta la necesidad de Paramedic sus [place].   Despierte al beb si han pasado 3  4 horas desde la ltima comida.   El amamantamiento frecuente la ayudar a producir ms [place] y a Education officer, community de Engineer, mining en los pezones e hinchazn de las [place].  LA POSICIN DEL BEB PARA AMAMANTARLO  Ya sea que se encuentre acostada o sentada, asegrese que el abdomen del beb enfrente el suyo.   Sostenga la mama con el pulgar por arriba y el resto de los dedos por debajo. Asegrese que sus dedos se encuentren lejos del pezn y de la boca del beb.   Toque suavemente los labios del beb y la mejilla ms cercana a la mama con el dedo o el pezn.   Cuando la boca del beb se abra lo suficiente, introduzca el pezn y la zona oscura que lo rodea tanto como le sea posible dentro de la boca.   Coloque a beb cerca suyo de modo que su nariz y mejillas toquen las mamas al [employer].  LAS COMIDAS  La duracin de cada comida vara de un beb a otro y de  una comida a [place].   El beb debe succionar alrededor American Financial o tres minutos para que le llegue [place]. Esto se denomina "bajada". Por este motivo, permita que el nio se alimente en cada mama todo lo que desee. Terminar de mamar cuando haya recibido la cantidad [place] de nutrientes.   Para detener la succin coloque su dedo en la comisura de la boca del nio y  Midwife entre sus encas antes de quitarle la mama de la boca. Esto la ayudar a English as a second language teacher.  REDUCIR LA CONGESTIN DE LAS MAMAS  Durante la primera semana despus del parto, usted puede experimentar Monsanto Company. Cuando las mamas estn congestionadas, se sienten calientes, llenas y molestas al tacto. Puede reducir la congestin si:   Lo amamanta frecuentemente, cada 2-3 horas. Las mams que [employer] Corporation pronto y con frecuencia tienen menos problemas de [place].   Coloque bolsas fras livianas entre cada [place]. Esto ayuda a Building services engineer. Envuelva las bolsas de hielo en una toalla liviana para proteger su piel.   Aplique compresas hmedas calientes [employer] W[employer]ley [employer] Company durante 5 a 10 minutos antes de amamantar al [employer]. Esto aumenta la circulacin y [place] a que la [place].   [place] suavemente la mama antes y Psychologist, sport and exercise.   Asegrese que el nio vaca al menos una mama antes de cambiar de lado.   Use un sacaleche para vaciar la mama si el beb se duerme o no se alimenta bien. Tambin podr Phel[employer] con esta bomba si tiene que volver al trabajo o siente que las mamas estn congestionadas.   Evite los biberones, chupetes o complementar la alimentacin con agua o jugos en lugar de la [place].   Verifique que el beb se encuentra en la posicin correcta mientras lo alimenta.   Evite el cansancio, el estrs y la anemia   Use un soutien que sostenga bien sus mamas y evite los que tienen aro.   Consuma una dieta balanceada y beba lquidos en cantidad.  Si sigue estas indicaciones, la congestin debe mejorar en 24 a 48 horas. Si an tiene dificultades, consulte a Barista. TENDR SUFICIENTE LECHE MI BEB? Algunas veces las madres se preocupan acerca de si sus bebs tendrn la leche suficiente. Puede asegurarse que el beb tiene la leche suficiente si:  El beb succiona y escucha que traga activamente.   El nio se  alimenta al menos 8 a 12 veces en 24 horas. Alimntelo hasta que se desprenda por sus propios medios o se quede dormido en la primera mama (al menos durante 10 a 20 minutos), luego ofrzcale el otro lado.   El beb moja 5 a 6 paales descartables (6 a 8 paales de tela) en 24 horas cuando tiene 5  6 das de vida.   Tiene al menos 2-3 deposiciones todos los [employer] primeros meses. La leche materna es todo el alimento que el beb necesita. No es necesario que el nio ingiera agua o preparados de bibern. De hecho, para ayudar a que sus mamas produzcan ms [place], lo mejor es no darle al beb suplementos durante las primeras semanas.   La materia fecal debe ser blanda y [place].   El beb debe aumentar 112 a 196 g por semana.  CUDESE Cuide sus mamas del siguiente modo:  Bese o dchese diariamente.   No lave sus pezones con jabn.   Comience a amamantar del lado izquierdo en una comida  y del lado derecho en la siguiente.   Notar que [employer] suministro de [place] a los 2 a 5 [address]  [address] despus del [place]. Puede sentir algunas molestias por la congestin, lo que hace que sus mamas estn duras y sensibles. La congestin disminuye en 24 a 48 horas. Mientras tanto, aplique toallas hmedas calientes durante 5 a 10 minutos antes de amamantar. Un masaje suave y la extraccin de un poco de leche antes de Museum/gallery exhibitions officer ablandarn las mamas y har ms fcil que el beb se agarre. Use un buen sostn y seque al aire los pezones durante 10 a 15 minutos luego de cada alimentacin.   Solo utilice apsitos de algodn.   Utilice lanolina [employer] pezones luego de [place]. No necesita lavarlos luego de alimentar al [employer].  Cudese del siguiente modo:   Consuma alimentos bien balanceados y refrigerios nutritivos.   Dixie Dials, jugos de fruta y agua para Warehouse manager sed (alrededor de 8 vasos por Futures trader).   Descanse lo suficiente.   Aumente la ingesta de calcio en la dieta (1200mg /da).   Evite los  alimentos que usted nota que puedan afectar al beb.  SOLICITE ATENCIN MDICA SI:  Tiene preguntas que formular o dificultades con la alimentacin a pecho.   Necesita ayuda.   Observa una zona dura, roja y que le duele en la zona de la mama, y se acompaa de fiebre de 100.5 F (38.1 C) o ms.   El beb est muy somnoliento como para alimentarse bien o tiene problemas para dormir.   El beb moja menos de 6 paales por da, a partir de los [address] de [place].   La piel del beb o la parte blanca de sus ojos est ms amarilla de lo que estaba en el hospital.   Se siente deprimida.  Document Released: 12/29/2004 Document Revised: 12/18/2010 [facility] Patient Information 2012 [place], [place].

## 2011-07-28 LAB — CULTURE, OB URINE

## 2011-07-29 ENCOUNTER — Inpatient Hospital Stay (HOSPITAL_COMMUNITY)
Admission: RE | Admit: 2011-07-29 | Discharge: 2011-07-30 | DRG: 775 | Disposition: A | Discharge status: Home / Self Care | Payer: Medicaid Other | Source: Ambulatory Visit | Attending: Family Medicine | Admitting: Family Medicine

## 2011-07-29 ENCOUNTER — Encounter (HOSPITAL_COMMUNITY): Payer: Self-pay

## 2011-07-29 VITALS — BP 110/68 | HR 58 | Temp 98.0°F | Resp 18 | Ht 62.0 in | Wt 188.0 lb

## 2011-07-29 DIAGNOSIS — O99019 Anemia complicating pregnancy, unspecified trimester: Secondary | ICD-10-CM

## 2011-07-29 DIAGNOSIS — O321XX0 Maternal care for breech presentation, not applicable or unspecified: Secondary | ICD-10-CM

## 2011-07-29 DIAGNOSIS — O24419 Gestational diabetes mellitus in pregnancy, unspecified control: Secondary | ICD-10-CM

## 2011-07-29 LAB — CBC
HCT: 25.7 % — ABNORMAL LOW (ref 36.0–46.0)
MCHC: 29.2 g/dL — ABNORMAL LOW (ref 30.0–36.0)
Platelets: 224 10*3/uL (ref 150–400)
RDW: 19.7 % — ABNORMAL HIGH (ref 11.5–15.5)

## 2011-07-29 LAB — RPR: RPR Ser Ql: NONREACTIVE

## 2011-07-29 LAB — GLUCOSE, RANDOM: Glucose, Bld: 57 mg/dL — ABNORMAL LOW (ref 70–99)

## 2011-07-29 LAB — PREPARE RBC (CROSSMATCH)

## 2011-07-29 MED ORDER — ONDANSETRON HCL 4 MG/2ML IJ SOLN: 4.0000 mg | Freq: Four times a day (QID) | INTRAMUSCULAR | Status: DC | PRN

## 2011-07-29 MED ORDER — FLEET ENEMA 7-19 GM/118ML RE ENEM: 1.0000 | ENEMA | RECTAL | Status: DC | PRN

## 2011-07-29 MED ORDER — IBUPROFEN 600 MG PO TABS
600.0000 mg | ORAL_TABLET | Freq: Four times a day (QID) | ORAL | Status: DC | PRN
  Administered 2011-07-29: 600 mg via ORAL
  Filled 2011-07-29: qty 1

## 2011-07-29 MED ORDER — TETANUS-DIPHTH-ACELL PERTUSSIS 5-2.5-18.5 LF-MCG/0.5 IM SUSP
0.5000 mL | Freq: Once | INTRAMUSCULAR | Status: AC
  Administered 2011-07-30: 0.5 mL via INTRAMUSCULAR
  Filled 2011-07-29: qty 0.5

## 2011-07-29 MED ORDER — ZOLPIDEM TARTRATE 5 MG PO TABS: 5.0000 mg | ORAL_TABLET | Freq: Every evening | ORAL | Status: DC | PRN

## 2011-07-29 MED ORDER — LACTATED RINGERS IV SOLN: 500.0000 mL | INTRAVENOUS | Status: DC | PRN

## 2011-07-29 MED ORDER — LANOLIN HYDROUS EX OINT: TOPICAL_OINTMENT | CUTANEOUS | Status: DC | PRN

## 2011-07-29 MED ORDER — SENNOSIDES-DOCUSATE SODIUM 8.6-50 MG PO TABS
2.0000 | ORAL_TABLET | Freq: Every day | ORAL | Status: DC
  Administered 2011-07-29: 2 via ORAL

## 2011-07-29 MED ORDER — SIMETHICONE 80 MG PO CHEW: 80.0000 mg | CHEWABLE_TABLET | ORAL | Status: DC | PRN

## 2011-07-29 MED ORDER — LACTATED RINGERS IV SOLN
INTRAVENOUS | Status: DC
  Administered 2011-07-29: 1000 mL via INTRAVENOUS

## 2011-07-29 MED ORDER — OXYCODONE-ACETAMINOPHEN 5-325 MG PO TABS
1.0000 | ORAL_TABLET | ORAL | Status: DC | PRN
  Administered 2011-07-29 – 2011-07-30 (×2): 1 via ORAL
  Filled 2011-07-29 (×2): qty 1

## 2011-07-29 MED ORDER — OXYTOCIN BOLUS FROM INFUSION
250.0000 mL | Freq: Once | INTRAVENOUS | Status: DC
  Filled 2011-07-29: qty 500

## 2011-07-29 MED ORDER — PRENATAL MULTIVITAMIN CH
1.0000 | ORAL_TABLET | Freq: Every day | ORAL | Status: DC
  Administered 2011-07-30: 1 via ORAL
  Filled 2011-07-29: qty 1

## 2011-07-29 MED ORDER — CITRIC ACID-SODIUM CITRATE 334-500 MG/5ML PO SOLN: 30.0000 mL | ORAL | Status: DC | PRN

## 2011-07-29 MED ORDER — LIDOCAINE HCL (PF) 1 % IJ SOLN
30.0000 mL | INTRAMUSCULAR | Status: DC | PRN
  Filled 2011-07-29: qty 30

## 2011-07-29 MED ORDER — OXYTOCIN 40 UNITS IN LACTATED RINGERS INFUSION - SIMPLE MED
62.5000 mL/h | Freq: Once | INTRAVENOUS | Status: AC
  Administered 2011-07-29: 250 [IU] via INTRAVENOUS

## 2011-07-29 MED ORDER — ONDANSETRON HCL 4 MG/2ML IJ SOLN: 4.0000 mg | INTRAMUSCULAR | Status: DC | PRN

## 2011-07-29 MED ORDER — OXYTOCIN 40 UNITS IN LACTATED RINGERS INFUSION - SIMPLE MED
1.0000 m[IU]/min | INTRAVENOUS | Status: DC
  Administered 2011-07-29: 2 m[IU]/min via INTRAVENOUS
  Filled 2011-07-29: qty 1000

## 2011-07-29 MED ORDER — DIPHENHYDRAMINE HCL 25 MG PO CAPS: 25.0000 mg | ORAL_CAPSULE | Freq: Four times a day (QID) | ORAL | Status: DC | PRN

## 2011-07-29 MED ORDER — IBUPROFEN 600 MG PO TABS
600.0000 mg | ORAL_TABLET | Freq: Four times a day (QID) | ORAL | Status: DC
  Administered 2011-07-29 – 2011-07-30 (×3): 600 mg via ORAL
  Filled 2011-07-29 (×3): qty 1

## 2011-07-29 MED ORDER — ACETAMINOPHEN 325 MG PO TABS: 650.0000 mg | ORAL_TABLET | ORAL | Status: DC | PRN

## 2011-07-29 MED ORDER — ONDANSETRON HCL 4 MG PO TABS: 4.0000 mg | ORAL_TABLET | ORAL | Status: DC | PRN

## 2011-07-29 MED ORDER — WITCH HAZEL-GLYCERIN EX PADS: 1.0000 "application " | MEDICATED_PAD | CUTANEOUS | Status: DC | PRN

## 2011-07-29 MED ORDER — TERBUTALINE SULFATE 1 MG/ML IJ SOLN: 0.2500 mg | Freq: Once | INTRAMUSCULAR | Status: DC | PRN

## 2011-07-29 MED ORDER — BENZOCAINE-MENTHOL 20-0.5 % EX AERO: 1.0000 "application " | INHALATION_SPRAY | CUTANEOUS | Status: DC | PRN

## 2011-07-29 MED ORDER — DIBUCAINE 1 % RE OINT: 1.0000 "application " | TOPICAL_OINTMENT | RECTAL | Status: DC | PRN

## 2011-07-29 MED ORDER — FENTANYL CITRATE 0.05 MG/ML IJ SOLN
100.0000 ug | INTRAMUSCULAR | Status: DC | PRN
  Administered 2011-07-29 (×2): 100 ug via INTRAVENOUS
  Filled 2011-07-29 (×2): qty 2

## 2011-07-29 MED ORDER — OXYCODONE-ACETAMINOPHEN 5-325 MG PO TABS: 1.0000 | ORAL_TABLET | ORAL | Status: DC | PRN

## 2011-07-29 NOTE — H&P (Signed)
Patient seen and examined.  Agree with above note.  Levie Heritage, DO 07/29/2011 10:24 AM

## 2011-07-29 NOTE — H&P (Signed)
Joanne Roberts is a 31 y.o. female G64P2002 @ 39 weeks 1 day presenting for onset of labor (induction of labor coincidentally scheduled for this morning). Has been checking blood sugars qid; fasting range 66-92 and pp range 89-132 with one abnormal. She did not take her glyburide this AM. Pt desires use of IV pain control, no epidural. Hx of 12 hour labor resulting in SVD following SROM and pitocin with oldest child; 6 hr labor resulting in SVD following AROM with second child. Pt plans to breast-feed, currently plans to use condoms for contraception though she has considered IUD in prior visits.  Maternal Medical History:  Reason for admission: Reason for admission: contractions.  Reason for Admission:   nauseaContractions: Onset was yesterday.   Frequency: regular.   Duration is approximately 2 minutes.   Perceived severity is moderate.    Fetal activity: Perceived fetal activity is normal.   Last perceived fetal movement was within the past hour.    Prenatal complications: Bleeding (first trimester, resolved.).   Prenatal Complications - Diabetes: gestational. Diabetes is managed by oral agent (monotherapy) and diet.      OB History    Grav Para Term Preterm Abortions TAB SAB Ect Mult Living   3 2 2  0 0 0 0 0 0 2     Past Medical History  Diagnosis Date   No pertinent past medical history    Diabetes mellitus    Anemia    Past Surgical History  Procedure Date   No past surgeries    Family History: family history includes Diabetes in her father. Social History:  reports that she has never smoked. She has never used smokeless tobacco. She reports that she does not drink alcohol or use illicit drugs.   Prenatal Transfer Tool  Maternal Diabetes: Yes:  Diabetes Type:  Insulin/Medication controlled Genetic Screening: Declined Maternal Ultrasounds/Referrals: Normal Fetal Ultrasounds or other Referrals:  Referred to Materal Fetal Medicine  Maternal Substance Abuse:   No Significant Maternal Medications:  None Significant Maternal Lab Results:  None Other Comments:  None  Review of Systems  Constitutional: Negative for fever and chills.  Eyes: Negative for blurred vision and double vision.  Respiratory: Negative for shortness of breath.   Cardiovascular: Negative for chest pain and leg swelling.  Gastrointestinal: Positive for abdominal pain (cramping with contractions). Negative for nausea, vomiting, diarrhea, constipation, blood in stool and melena.  Genitourinary: Negative for dysuria, urgency, frequency and hematuria.  Neurological: Positive for dizziness (occasionally with contractions; resolves after contractions; none with standing.). Negative for headaches.    Dilation: 3.5 Effacement (%): 80 Station: 0 Exam by:: Dherr rn Blood pressure 117/60, pulse 60, temperature 98.1 F (36.7 C), temperature source Oral, resp. rate 20, height 5\' 2"  (1.575 m), weight 85.276 kg (188 lb), last menstrual period 09/20/2010. Maternal Exam:  Uterine Assessment: Contraction strength is moderate.  Contraction duration is 1 minute. Contraction frequency is regular.   Abdomen: Patient reports no abdominal tenderness. Fetal presentation: vertex  Introitus: Normal vulva. Normal vagina.  Amniotic fluid character: clear and bloody.  Pelvis: adequate for delivery.   Cervix: Cervix evaluated by digital exam.     Fetal Exam Fetal Monitor Review: Baseline rate: 125.  Variability: moderate (6-25 bpm).   Pattern: accelerations present and early decelerations.    Fetal State Assessment: Category I - tracings are normal.     Physical Exam  Nursing note and vitals reviewed. Constitutional: She is oriented to person, place, and time. She appears well-developed and well-nourished.  No distress.  HENT:  Head: Normocephalic and atraumatic.  Cardiovascular: Normal rate, regular rhythm, normal heart sounds and intact distal pulses.  Exam reveals no gallop and no  friction rub.   No murmur heard. Respiratory: Effort normal and breath sounds normal. No respiratory distress.  GI: Soft. Bowel sounds are normal. There is Tenderness: mild..  Genitourinary: Vagina normal.  Musculoskeletal: Normal range of motion.  Neurological: She is alert and oriented to person, place, and time.  Skin: Skin is warm and dry. No rash noted. No erythema.  Psychiatric: She has a normal mood and affect. Her behavior is normal. Judgment and thought content normal.    Prenatal labs: ABO, Rh: --/--/A POS (12/06 1715) Antibody: NEG (03/27 0939) Rubella: 135.3 (03/27 0939) RPR: NON REAC (03/27 0939)  HBsAg: NEGATIVE (03/27 0939)  HIV: NON REACTIVE (03/27 0939)  GBS: Negative (07/01 0000)   Assessment/Plan: 31 yo G3P2002 female @ 39 weeks 1 day by 7 week [place] in active labor.  1. Admission to birthing suites. 2. AROM - performed at 9:30. Dilated to 4 cm. Will give pitocin to progress labor. 3. Pain management - IV pain medication. 4. Plan for SVD.  Latina Craver, PA-S 07/29/2011, 9:14 AM

## 2011-07-29 NOTE — Progress Notes (Signed)
  Subjective: Patient complains of pain with contractions.  States she just received pain medication for this snd that it is helping a little.  Objective: BP 116/90  Pulse 55  Temp 97.6 F (36.4 C) (Axillary)  Resp 18  Ht 5\' 2"  (1.575 m)  Wt 85.276 kg (188 lb)  BMI 34.39 kg/m2  SpO2 100%  LMP 09/20/2010      FHT:  FHR: 115 bpm, variability: moderate,  accelerations:  Abscent,  decelerations:  Present intermittent early and intermittent variables UC:   irregular, every 1-3 minutes SVE:   Dilation: 7 Effacement (%): 90 Station: 0 Exam by:: Dherr rn  Labs: Lab Results  Component Value Date   WBC 8.8 07/29/2011   HGB 7.5* 07/29/2011   HCT 25.7* 07/29/2011   MCV 69.5* 07/29/2011   PLT 224 07/29/2011    Assessment / Plan: Labor progressing well. Fentanyl for pain.  Will continue to support through labor.  Marikay Alar 07/29/2011, 12:47 PM

## 2011-07-30 ENCOUNTER — Encounter (HOSPITAL_COMMUNITY): Payer: Self-pay

## 2011-07-30 MED ORDER — IBUPROFEN 600 MG PO TABS: 600.0000 mg | ORAL_TABLET | Freq: Four times a day (QID) | ORAL | Status: AC

## 2011-07-30 NOTE — Discharge Summary (Signed)
Obstetric Discharge Summary Reason for Admission: onset of labor Prenatal Procedures: ultrasound Intrapartum Procedures: spontaneous vaginal delivery Postpartum Procedures: none Complications-Operative and Postpartum: 1st degree perineal laceration Hemoglobin  Date Value Range Status  07/29/2011 7.5* 12.0 - 15.0 g/dL Final     HCT  Date Value Range Status  07/29/2011 25.7* 36.0 - 46.0 % Final    Physical Exam:  General: alert and cooperative Lochia: appropriate Uterine Fundus: firm Incision: n/a DVT Evaluation: No evidence of DVT seen on physical exam. Negative Homan's sign. No cords or calf tenderness. No significant calf/ankle edema.  Discharge Diagnoses: Term Pregnancy-delivered  Discharge Information: Date: 07/30/2011 Activity: unrestricted Diet: routine Medications: PNV and Ibuprofen Condition: stable Instructions: refer to practice specific booklet Discharge to: home Plan for discharge this afternoon.  Newborn Data: Live born female  Birth Weight: 7 lb 13.2 oz (3550 g) APGAR: 10, 8  Home with mother.  Marikay Alar 07/30/2011, 7:28 AM  I have seen and examined this patient and I agree with the above. Cam Hai 8:38 AM 07/30/2011

## 2011-07-30 NOTE — Progress Notes (Signed)
UR Chart review completed.  

## 2011-07-31 LAB — TYPE AND SCREEN: Unit division: 0

## 2011-08-07 NOTE — Progress Notes (Signed)
NST 07-02-11 reactive 

## 2011-08-26 ENCOUNTER — Encounter: Payer: Self-pay | Admitting: Advanced Practice Midwife

## 2011-08-26 ENCOUNTER — Ambulatory Visit (INDEPENDENT_AMBULATORY_CARE_PROVIDER_SITE_OTHER): Payer: Self-pay | Admitting: Advanced Practice Midwife

## 2011-08-26 VITALS — BP 108/66 | HR 55 | Temp 96.8°F | Resp 20 | Wt 165.9 lb

## 2011-08-26 DIAGNOSIS — Z3049 Encounter for surveillance of other contraceptives: Secondary | ICD-10-CM

## 2011-08-26 MED ORDER — ETONOGESTREL-ETHINYL ESTRADIOL 0.12-0.015 MG/24HR VA RING: VAGINAL_RING | VAGINAL | Status: DC

## 2011-08-26 NOTE — Patient Instructions (Signed)
Joanne Roberts. [facility] Department. [phone]. Vasectomy program.   International aid/development worker (Vasectomy) La vasectoma es la oclusin (con o sin corte) del tubo que lleva la esperma desde el testculo (conducto deferente). La vasectoma impide que la esperma vaya hasta el conducto deferente y el pene, de modo que durante las relaciones sexuales, no llegue a la vagina. Es un procedimiento seguro, con muy pocas complicaciones. No afecta el deseo ni el desempeo sexual.  Como es un procedimiento de [employer], no debe hacerlo hasta estar seguro de que no quiere tener hijos. Usted y su pareja deben estar en un todo de acuerdo antes del procedimiento. La decisin de [employer] vasectoma no debe tomarse durante una situacin estresante. Por ejemplo en caso de prdida de un embarazo, enfermedad, muerte de la esposa o divorcio. Existen otros mtodos anticonceptivos que pueden utilizarse hasta que est completamente seguro de que quiere realizarlo. Este procedimiento no lo proteger contra las infecciones transmitidas sexualmente. Los hombres que desean que la vasectoma sea revertida, necesitan someterse a una operacin. Puede que no se obtenga un buen resultado. Tiene menos riesgos y es menos costosa que Education officer, environmental una esterilizacin tubrica en la mujer.  INFORME AL PROFESIONAL SOBRE LOS SIGUIENTES PUNTOS:  Sufre [employer] toma, incluyendo hierbas, gotas oftlmicas, medicamentos de venta libre y cremas   Uso de esteroides (por va oral o cremas)   Problemas anteriores debido a anestsicos o a Patent examiner.   Antecedentes de haber tenido cogulos sanguneos (tromboflebitis)   Antecedentes de hemorragias o problemas sanguneos   Cirugas anteriores  RIESGOS Y COMPLICACIONES  Fracaso en el procedimiento para provocar infertilidad significa que la mujer puede quedar embarazada   Dolor en el postoperatorio Generalmente se trata con analgsicos.   Infeccin. Un  germen comienza a desarrollarse en la herida. Generalmente se trata con antibiticos.   Una reaccin alrgica a la anestesia o a otro Radiographer, therapeutic.   Hemorragias. La hemorragia puede aparecer debajo de la piel, de modo que el escroto y el pene parecen tener hematomas. En algunos casos el escroto puede hincharse y tener el tamao de un pomelo. Esto generalmente desaparece en [employer].  PROCEDIMIENTO  El escroto se limpia con jabn antibacterial y el profesional busca el conducto deferente.   Se anestesia cada lado del escroto.   Se realiza un corte muy pequeo(incisin) y el conducto deferente se saca afuera del escroto. El conducto deferente se ata, se corta o se quema (cauterizacin) en los extremos.   En algunos casos se retira del escroto a travs de una puncin. Esto se realiza con un instrumento especial, sin incisin.   Luego el conducto deferente se coloca nuevamente en el escroto y la incisin o puncin se cierran.   Luego de la [place], podr haber esperma en el conducto deferente por 1 a 3 meses. Por ello, debe utilizarse otro mtodo anticonceptivo hasta que el mdico lo examine y encuentre que no hay esperma en el lquido seminal.   La castracin es otro mtodo que consiste en la remocin de ambos testculos. Esto produce la esterilidad en el hombre.  La realizacin de una vasectoma debe discutirse con el mdico, estando su pareja presente. Formule preguntas y hable acerca de sus preocupaciones con el profesional que lo asiste. Luego, puede decidir si la operacin es segura para usted. Puede cambiar de opinin y cancelar la ciruga en cualquier momento.  INSTRUCCIONES PARA EL CUIDADO DOMICILIARIO  Utilice los medicamentos de venta [place]  o de prescripcin para el dolor, el malestar o la fiebre, segn se lo indique el profesional que lo asiste.   Una bolsa de hielo colocada durante 15 a 20 minutos, 3 a 4 veces por da disminuir la hinchazn.    Utilice un suspensor deportivo para disminuir la hinchazn.   Evite las [employer] primeros [address]  [address] posteriores a la [place].   Mantenga las incisiones limpias y cubiertas para evitar la infeccin.   [employer] risk analyst o segundo da despus de la [place], es normal cierta prdida de [place].   No practique deportes ni realice trabajos fsicos pesados durante al [employer].   Siga las instrucciones del mdico con respecto a la dieta, el reposo, el [place], las [place] y sexuales y cumpla con las visitas de control.  SOLICITE ATENCIN MDICA DE INMEDIATO SI:  Presenta enrojecimiento, hinchazn o aumento del dolor en la herida o en los testculos.   Aparece pus en la herida.   La temperatura oral se eleva sin motivo por encima de 102 F (38.9 C) o segn le indique el profesional que lo asiste.   Advierte un olor ftido que proviene de la herida o del vendaje.   La herida se abre o los bordes se separan, an luego de la remocin de las suturas. En algunos casos las incisiones no se suturan.   Observa una hemorragia que proviene de la herida.  EST SEGURO QUE:   Comprende las instrucciones para el alta mdica.   Controlar su enfermedad.   Solicitar atencin mdica de inmediato segn las indicaciones.  Document Released: 06/17/2007 Document Revised: 12/18/2010 [facility] Patient Information 2012 [place], [place].  Eleccin del mtodo anticonceptivo  (Contraception Choices) La anticoncepcin (control de la natalidad) es el uso de cualquier mtodo o dispositivo para Location manager. A continuacin se indican algunos de esos mtodos.  MTODOS HORMONALES   Implante anticonceptivo. Es un tubo plstico delgado que contiene la hormona progesterona. No contiene estrgenos. El mdico inserta el tubo en la parte interna del brazo. El tubo puede Geneticist, molecular durante 3 aos. Despus de los 3 aos debe retirarse. El implante impide que los ovarios  liberen vulos (ovulacin), espesa el moco cervical, lo que evita que los espermatozoides ingresen al tero y hace ms delgada la membrana que cubre el interior del tero.   Inyecciones de progesterona sola. Estas inyecciones se administran cada 3 meses para evitar el embarazo. La progesterona sinttica impide que los ovarios liberen vulos. Tambin hace que el moco cervical se espese y modifica el recubrimiento interno del tero. Esto hace ms difcil que los espermatozoides sobrevivan en el tero.   Pldoras anticonceptivas. Las pldoras anticonceptivas contienen estrgenos y Education officer, museum. Actan impidiendo que el vulo se forme en el ovario(ovulacin). Las pldoras anticonceptivas son recetadas por el mdico.Tambin se utilizan para tratar los perodos menstruales abundantes.   Minipldora. Este tipo de pldora anticonceptiva contiene slo hormona progesterona. Deben tomarse todos los [address]  [address] del mes y debe recetarlas el mdico.   Parches anticonceptivos. El parche contiene hormonas similares a las que contienen las pldoras anticonceptivas. Deben cambiarse una vez por semana y se utilizan bajo prescripcin mdica.   Anillo vaginal. Anillo vaginal contiene hormonas similares a las que contienen las pldoras anticonceptivas. Se deja colocado durante tres semanas, se lo retira durante 1 semana y luego se coloca uno nuevo. La paciente debe sentirse cmoda para insertar y retirar el anillo de la vagina.Es necesaria la receta del mdico.   Anticonceptivos de  emergencia. Los anticonceptivos de emergencia son mtodos para evitar un embarazo despus de una relacin sexual sin proteccin. Esta pldora puede tomarse inmediatamente despus de Child psychotherapist sexuales o hasta 5 Anton de haber tenido sexo sin proteccin. Es ms efectiva si se toma poco tiempo despus. Los anticonceptivos de emergencia estn disponibles sin prescripcin mdica. Consltelo con su farmacutico. No use los anticonceptivos de emergencia  como nico mtodo anticonceptivo.  MTODOS DE BARRERA   Condn masculino. Es una vaina delgada (ltex o goma) que se [place] en el pene durante el acto sexual. Deri Fuelling con espermicida para aumentar la efectividad.   Condn femenino. Es una vaina blanda y floja que se adapta suavemente a la vagina antes de las relaciones sexuales.   Diafragma. Es una barrera de ltex redonda y Casimer Bilis que debe ser ajustada por un profesional. Se inserta en la vagina, junto con un gel espermicida. Debe insertarse antes de Management consultant. Debe dejar el diafragma colocado en la vagina durante 6 a 8 horas despus de la relacin sexual.   Capuchn cervical. Es una taza de ltex o plstico, redonda y [place] que cubre el cuello del tero y debe ser ajustada por un mdico. Puede dejarlo colocado en la vagina hasta 48 horas despus de las Clinical research associate.   Esponja. Es una pieza blanda y circular de espuma de poliuretano. Contiene un espermicida. Se inserta en la vagina despus de mojarla y antes de las [employer].   Espermicidas. Los espermicidas son qumicos que matan o bloquean el esperma y no lo dejan ingresar al cuello del tero y al tero. Vienen en forma de cremas, geles, supositorios, espuma o comprimidos. No es necesario tener Emergency planning/management officer. Se insertan en la vagina con un aplicador antes de Management consultant. El proceso debe repetirse cada vez que tiene relaciones sexuales.  ANTICONCEPTIVOS INTRAUTERINOS   Dispositivo intrauterino (DIU). Es un dispositivo en forma de T que se coloca en el tero durante el perodo menstrual, para Location manager. Hay dos tipos:   DIU de cobre. Este tipo de DIU est recubierto con un alambre de cobre y se inserta dentro del tero. El cobre hace que el tero y las trompas de Falopio produzcan un liquido que [employer] espermatozoides. Puede permanecer colocado durante 10 aos.   DIU hormonal. Este tipo de DIU contiene la hormona progestina  (progesterona sinttica). La hormona espesa el moco cervical y evita que los espermatozoides ingresen al tero y tambin afina la membrana que cubre el tero para evitar la implantacin del vulo fertilizado. La hormona debilita o destruye los espermatozoides que ingresan al tero. Puede permanecer colocado durante 5 aos.  MTODOS ANTICONCEPTIVOS PERMANENTES   Ligadura de trompas en la mujer. La ligadura de trompas en la mujer se realiza sellando, atando u obstruyendo quirrgicamente las trompas de Falopio lo que impide que el vulo descienda hacia el tero.   Esterilizacin masculina. Se realiza atando los conductos por los que pasan los espermatozoides (vasectoma).Esto impide que el esperma ingrese a la vagina durante el acto sexual. Luego del procedimiento, el hombre puede eyacular lquido (semen).  MTODOS DE PLANIFICACIN NATURAL   Planificacin familiar natural.  Consiste en no tener relaciones sexuales o usar un mtodo de barrera (condn, Olar, capuchn cervical) en los [employer] la mujer podra quedar [place].   Mtodo calendario.  Consiste en el seguimiento de la duracin de cada ciclo menstrual y la identificacin de los perodos frtiles.   Mtodo de Occupational hygienist.  Consiste en evitar  las relaciones sexuales durante la ovulacin.   Mtodo sintotrmico. Paramedic las relaciones sexuales en la poca en la que se est ovulando, utilizando un termmetro y tendiendo en cuenta los sntomas de la ovulacin.   Mtodo post-ovulacin. Consiste en planificar las relaciones sexuales para despus de haber ovulado.  Independientemente del tipo o mtodo anticonceptivo que usted elija, es importante que use condones para protegerse contra las enfermedades de transmisin sexual (ETS). Hable con su mdico con respecto a qu mtodo anticonceptivo es el ms apropiado para usted.  Document Released: 12/29/2004 Document Revised: 12/18/2010 [facility] Patient Information 2012 [place],  [place].

## 2011-08-26 NOTE — Progress Notes (Unsigned)
  Subjective:     Joanne Roberts is a 31 y.o. female who presents for a postpartum visit. She is 4 weeks postpartum following a spontaneous vaginal delivery. I have fully reviewed the prenatal and intrapartum course. The delivery was at term gestational weeks. Outcome: spontaneous vaginal delivery. Anesthesia: IV sedation. Postpartum course has been normal. Baby's course has been normal. Baby is feeding by both breast and bottle -  . Bleeding staining only. Bowel function is normal. Bladder function is normal. Patient is not sexually active. Contraception method is condoms. Postpartum depression screening: negative.  The following portions of the patient's history were reviewed and updated as appropriate: allergies, current medications, past family history, past medical history, past social history, past surgical history and problem list.  Review of Systems Pertinent items are noted in HPI.   Objective:    BP 108/66  Pulse 55  Temp 96.8 F (36 C) (Oral)  Resp 20  Wt 165 lb 14.4 oz (75.252 kg)  Breastfeeding? Yes  General:  alert, cooperative and no distress   Breasts:  inspection negative, no nipple discharge or bleeding, no masses or nodularity palpable  Lungs: clear to auscultation bilaterally  Heart:  regular rate and rhythm, S1, S2 normal, no murmur, click, rub or gallop  Abdomen: soft, non-tender; bowel sounds normal; no masses,  no organomegaly   Vulva:  {labia exam:12198}  Vagina: {vagina exam:12200}  Cervix:  {cervix exam:14595}  Corpus: {uterus exam:12215}  Adnexa:  {adnexa exam:12223}  Rectal Exam: {rectal/vaginal exam:12274}        Assessment:    *** postpartum exam. Pap smear {done:10129} at today's visit.   Plan:    1. Contraception: {method:5051} 2. *** 3. Follow up in: {1-10:13787} {time; units:19136} or as needed.

## 2011-08-26 NOTE — Progress Notes (Unsigned)
Patient ID: Joanne Roberts, female   DOB: 08/27/1980, 31 y.o.   MRN: [id number] Encouraged pt to take PNV- 1 tab daily.

## 2011-09-01 ENCOUNTER — Other Ambulatory Visit: Payer: Self-pay

## 2011-09-01 DIAGNOSIS — O99810 Abnormal glucose complicating pregnancy: Secondary | ICD-10-CM

## 2011-09-01 DIAGNOSIS — O99019 Anemia complicating pregnancy, unspecified trimester: Secondary | ICD-10-CM

## 2011-09-02 ENCOUNTER — Telehealth: Payer: Self-pay | Admitting: *Deleted

## 2011-09-02 NOTE — Telephone Encounter (Signed)
Message copied by Mannie Stabile on Wed Sep 02, 2011  2:32 PM ------      Message from: Adam Phenix      Created: Wed Sep 02, 2011  2:17 PM       Normal 2 hr GTT

## 2011-09-02 NOTE — Telephone Encounter (Signed)
Pt informed of normal result. She wanted to know what she should do about the low iron level. I told her I would send a message to Dr. Debroah Loop and we will call her back with his feedback. Pt agrees.

## 2011-09-02 NOTE — Progress Notes (Signed)
Pt informed of normal 2hr gtt.

## 2011-09-17 NOTE — Progress Notes (Signed)
07/24/11--NST reactive

## 2011-09-21 ENCOUNTER — Telehealth: Payer: Self-pay | Admitting: *Deleted

## 2011-09-21 ENCOUNTER — Other Ambulatory Visit: Payer: Self-pay | Admitting: Obstetrics & Gynecology

## 2011-09-21 DIAGNOSIS — D649 Anemia, unspecified: Secondary | ICD-10-CM

## 2011-09-21 MED ORDER — FERROUS SULFATE 325 (65 FE) MG PO TABS: 325.0000 mg | ORAL_TABLET | Freq: Every day | ORAL | Status: DC

## 2011-09-21 NOTE — Telephone Encounter (Signed)
Message copied by Mannie Stabile on Mon Sep 21, 2011  2:02 PM ------      Message from: Adam Phenix      Created: Mon Sep 21, 2011  1:10 PM        I see that was not addressed after delivery or postpartum. Will rx Fe supplement

## 2011-09-21 NOTE — Telephone Encounter (Signed)
Attempted to call patient to let her know Dr. Debroah Loop is prescribing iron for her anemia. I left her a message to call us back.

## 2011-09-22 NOTE — Telephone Encounter (Signed)
Called patient and left her a voicemail to call us back.  

## 2011-09-23 NOTE — Telephone Encounter (Signed)
Called pt and left her a message informing her that medicine is at her pharmacy if she has questions she can call back.

## 2013-10-26 IMAGING — US US OB FOLLOW-UP
1 series · 12 of 28 positions shown · non-contrast
Comparison: none

[Series 1: us ob follow up · 12 of 46 slices shown]
[im 2/46]
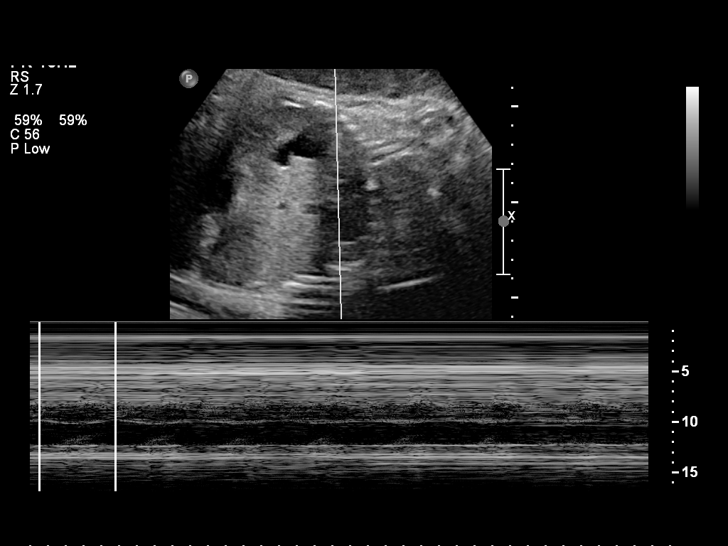
[im 6/46]
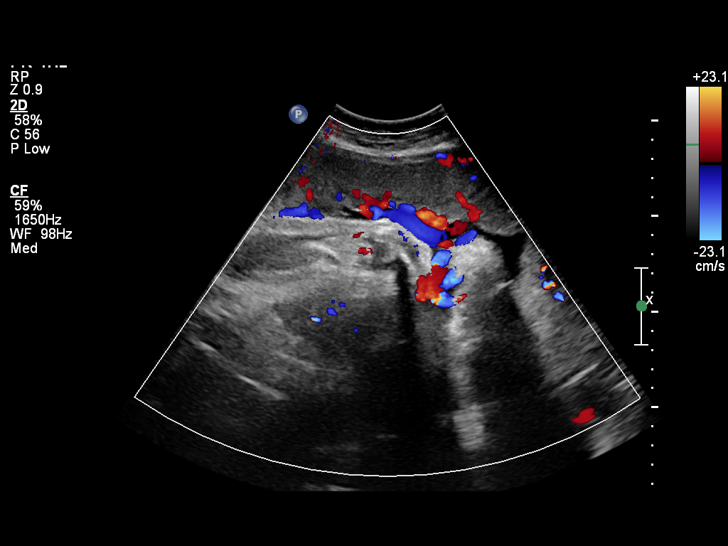
[im 9/46]
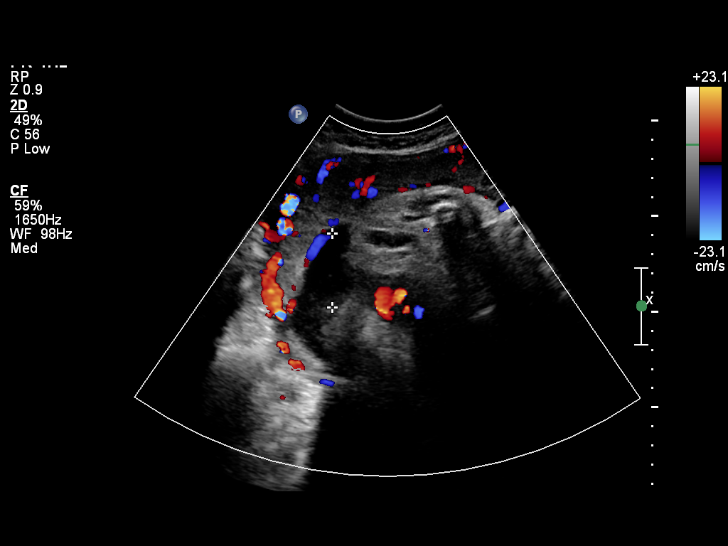
[im 14/46]
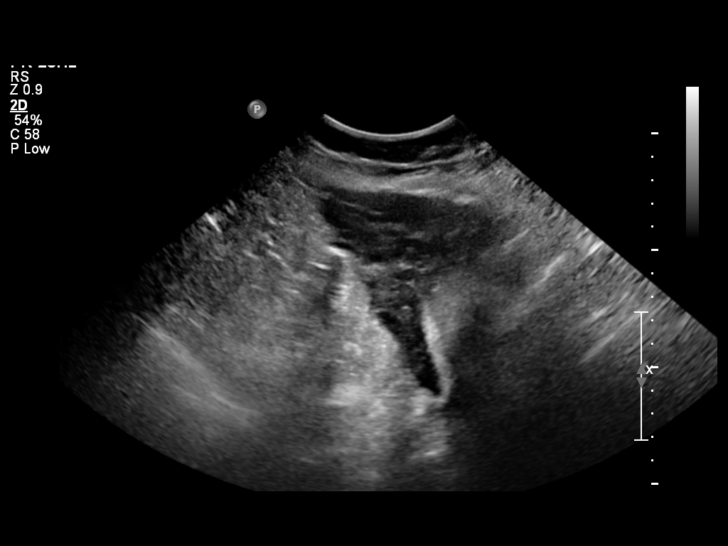
[im 17/46]
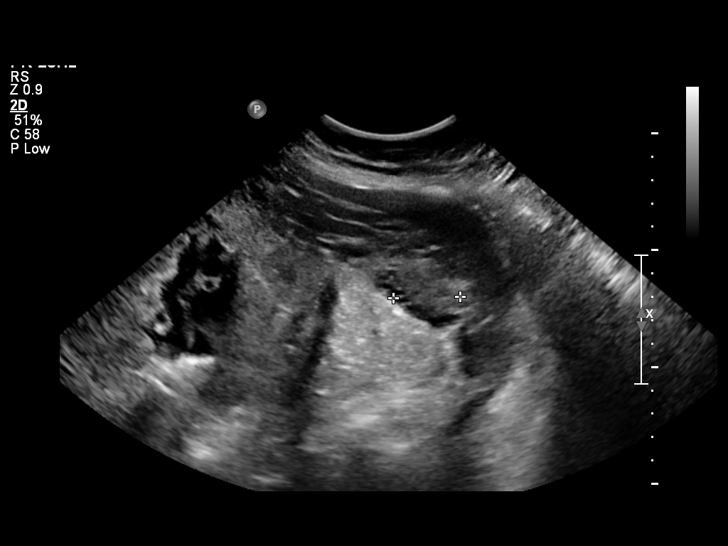
[im 21/46]
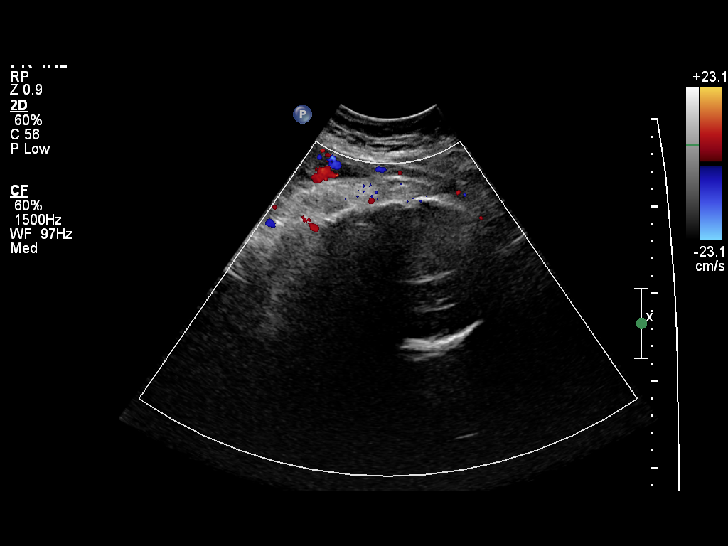
[im 26/46]
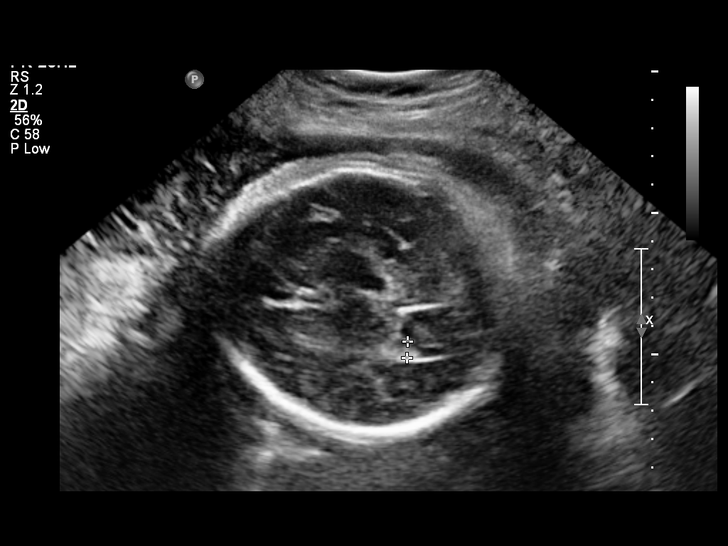
[im 29/46]
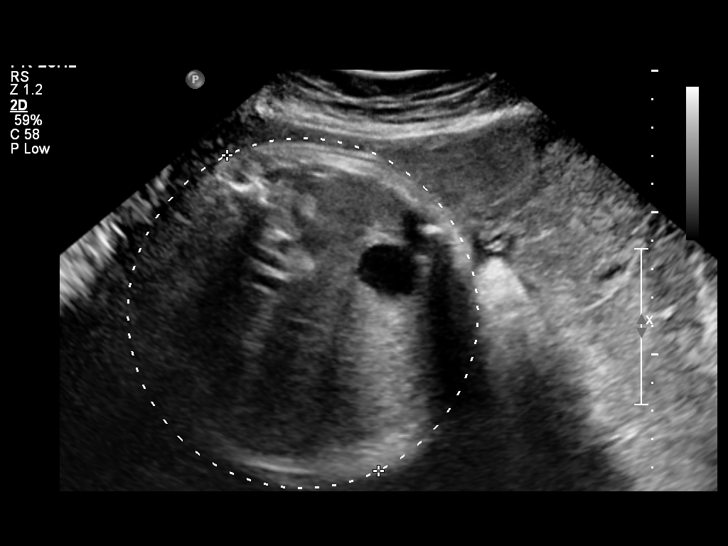
[im 32/46]
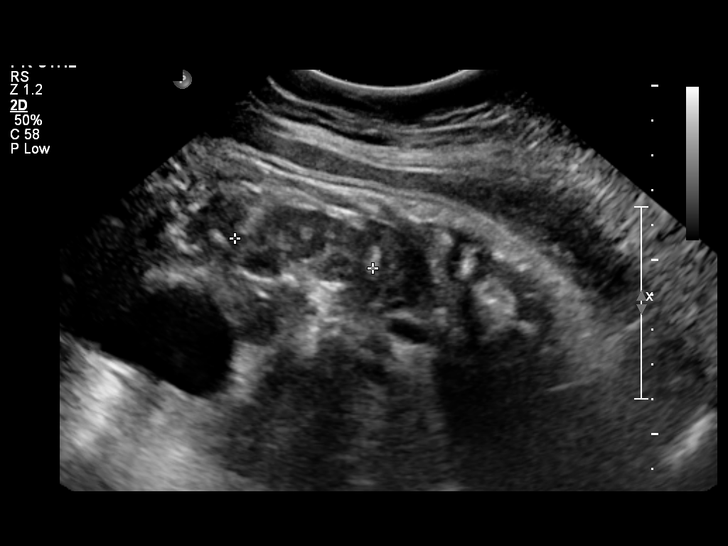
[im 37/46]
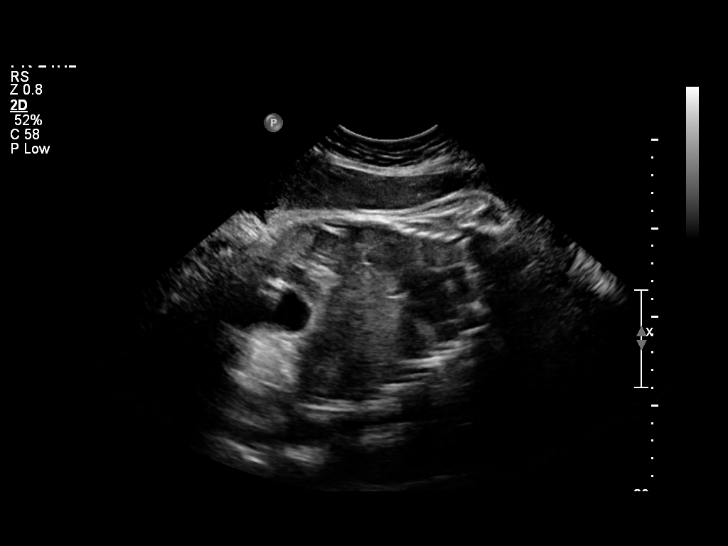
[im 41/46]
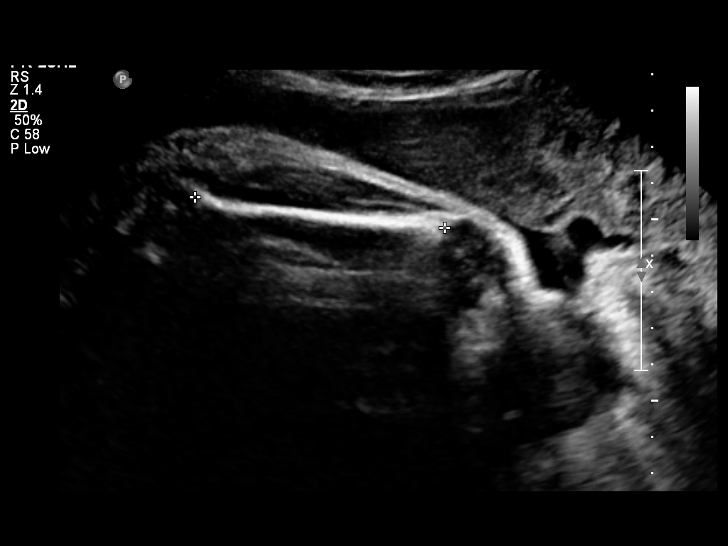
[im 44/46]
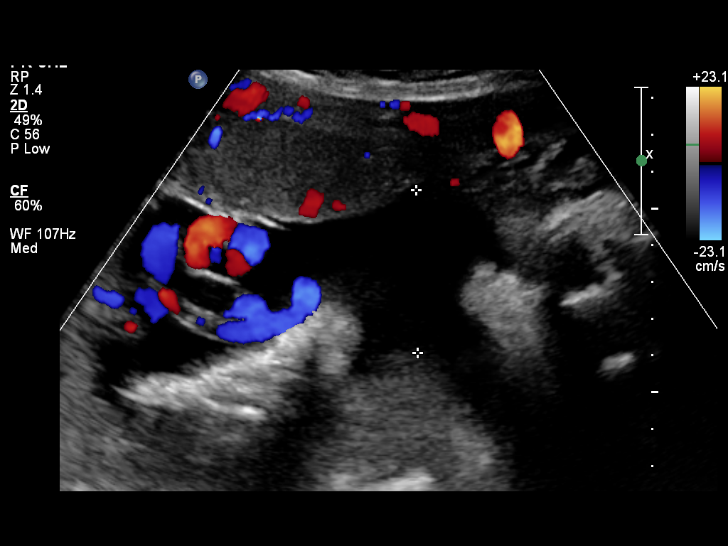

[12 of 28 positions shown; findings below may reference images not displayed]

OBSTETRICS REPORT
                      (Signed Final 07/20/2011 [DATE])

 Order#:         [id number]
Procedures

 US OB FOLLOW UP                                       76816.1
Indications

 Diabetes - Gestational, A2
 Size greater than dates (Large for gestational [AGE]
Fetal Evaluation

 Fetal Heart Rate:  140                         bpm
 Cardiac Activity:  Observed
 Presentation:      Cephalic
 Placenta:          Anterior, above cervical os
 P. Cord            Visualized
 Insertion:

 Amniotic Fluid
 AFI FV:      Subjectively within normal limits
 AFI Sum:     11.27   cm      35   %Tile     Larg Pckt:   4.41   cm
 RUQ:   2.52   cm    RLQ:    4.41   cm    LUQ:   3.24    cm   LLQ:    1.1    cm
Biometry

 BPD:     90.3  mm    G. Age:   36w 4d                CI:        70.98   70 - 86
                                                      FL/HC:      20.1   20.9 -

 HC:     341.5  mm    G. Age:   39w 2d       65  %    HC/AC:      0.89   0.92 -

 AC:     382.2  mm    G. Age:   N/A        > 97  %    FL/BPD:     76.1   71 - 87
 FL:      68.7  mm    G. Age:   35w 2d        5  %    FL/AC:      18.0   20 - 24

 Est. FW:    3155  gm    8 lb 10 oz    > 90  %
Gestational Age

 U/S Today:     37w 0d                                        EDD:   08/10/11
 Best:          37w 6d    Det. By:   Early Ultrasound         EDD:   08/04/11
                                     (12/18/10)
Anatomy

 Cranium:           Appears normal      Aortic Arch:       Basic anatomy
                                                           exam per order
 Fetal Cavum:       Previously seen     Ductal Arch:       Basic anatomy
                                                           exam per order
 Ventricles:        Appears normal      Diaphragm:         Appears normal
 Choroid Plexus:    Appears normal      Stomach:           Appears
                                                           normal, left
                                                           sided
 Cerebellum:        Previously seen     Abdomen:           Previously seen
 Posterior Fossa:   Not well            Abdominal Wall:    Not well
                    visualized                             visualized
 Nuchal Fold:       Not applicable      Cord Vessels:      Previously seen
                    (>20 wks GA)
 Face:              Lips previously     Kidneys:           Appear normal
                    seen
 Heart:             Previously seen     Bladder:           Appears normal
 RVOT:              Previously seen     Spine:             Previously seen
 LVOT:              Previously seen     Limbs:             Not well
                                                           visualized

 Other:     Nasal bone prev. visualized. Technically difficult due to
            advanced GA and fetal position.
Targeted Anatomy

 Fetal Central Nervous System
 Lat. Ventricles:
Cervix Uterus Adnexa

 Cervix:       Not visualized (advanced GA >34 wks)

 Left Ovary:   Within normal limits.
 Right Ovary:  Not visualized.
 Adnexa:     No abnormality visualized.
Impression

 Assigned GA is currently 37w 6d.   Fetus is now large-for-
 gestational-age (EFW of 6800gm >90 %ile).
 Amniotic fluid within normal limits, with AFI of 11.27 cm.

 questions or concerns.

## 2013-11-13 ENCOUNTER — Encounter: Payer: Self-pay | Admitting: Advanced Practice Midwife

## 2016-09-17 ENCOUNTER — Other Ambulatory Visit (HOSPITAL_COMMUNITY): Payer: Self-pay | Admitting: Nurse Practitioner

## 2016-09-17 DIAGNOSIS — Z3689 Encounter for other specified antenatal screening: Secondary | ICD-10-CM

## 2016-09-17 LAB — OB RESULTS CONSOLE HEPATITIS B SURFACE ANTIGEN: Hepatitis B Surface Ag: NEGATIVE

## 2016-09-17 LAB — OB RESULTS CONSOLE GC/CHLAMYDIA
CHLAMYDIA, DNA PROBE: NEGATIVE
Gonorrhea: NEGATIVE

## 2016-09-17 LAB — OB RESULTS CONSOLE HGB/HCT, BLOOD
HCT: 268
Hemoglobin: 8.7

## 2016-09-17 LAB — GLUCOSE, 1 HOUR: Glucose 1 Hour: 232

## 2016-09-17 LAB — OB RESULTS CONSOLE PLATELET COUNT: PLATELETS: 247

## 2016-09-17 LAB — OB RESULTS CONSOLE RUBELLA ANTIBODY, IGM: Rubella: IMMUNE

## 2016-09-17 LAB — GLUCOSE TOLERANCE, 1 HOUR: GLUCOSE 1 HOUR GTT: 232

## 2016-09-17 LAB — CYSTIC FIBROSIS DIAGNOSTIC STUDY: Interpretation-CFDNA:: NEGATIVE

## 2016-09-17 LAB — CYTOLOGY - PAP: PAP SMEAR: NEGATIVE

## 2016-09-17 LAB — OB RESULTS CONSOLE VARICELLA ZOSTER ANTIBODY, IGG: VARICELLA IGG: NON-IMMUNE/NOT IMMUNE

## 2016-09-17 LAB — OB RESULTS CONSOLE RPR: RPR: NONREACTIVE

## 2016-09-17 LAB — CULTURE, OB URINE: Urine Culture, OB: NEGATIVE

## 2016-09-17 LAB — OB RESULTS CONSOLE HIV ANTIBODY (ROUTINE TESTING): HIV: NONREACTIVE

## 2016-09-25 ENCOUNTER — Encounter (HOSPITAL_COMMUNITY): Payer: Self-pay | Admitting: *Deleted

## 2016-09-29 ENCOUNTER — Ambulatory Visit (HOSPITAL_COMMUNITY)
Admission: RE | Admit: 2016-09-29 | Discharge: 2016-09-29 | Disposition: A | Discharge status: Home / Self Care | Payer: Self-pay | Source: Ambulatory Visit | Attending: Nurse Practitioner | Admitting: Nurse Practitioner

## 2016-09-29 ENCOUNTER — Encounter (HOSPITAL_COMMUNITY): Payer: Self-pay

## 2016-09-29 ENCOUNTER — Encounter: Payer: Self-pay | Admitting: *Deleted

## 2016-09-29 ENCOUNTER — Other Ambulatory Visit (HOSPITAL_COMMUNITY): Payer: Self-pay | Admitting: *Deleted

## 2016-09-29 ENCOUNTER — Other Ambulatory Visit: Payer: Self-pay

## 2016-09-29 DIAGNOSIS — Z3A23 23 weeks gestation of pregnancy: Secondary | ICD-10-CM | POA: Insufficient documentation

## 2016-09-29 DIAGNOSIS — O24410 Gestational diabetes mellitus in pregnancy, diet controlled: Secondary | ICD-10-CM | POA: Insufficient documentation

## 2016-09-29 DIAGNOSIS — Z3689 Encounter for other specified antenatal screening: Secondary | ICD-10-CM | POA: Insufficient documentation

## 2016-09-29 DIAGNOSIS — O09529 Supervision of elderly multigravida, unspecified trimester: Secondary | ICD-10-CM | POA: Insufficient documentation

## 2016-09-29 NOTE — Addendum Note (Signed)
Encounter addended by: Augustin Coupe on: 09/29/2016  4:38 PM<BR>    Actions taken: Sign clinical note

## 2016-09-29 NOTE — Progress Notes (Addendum)
Genetic Counseling  High-Risk Gestation Note  Appointment Date:  09/29/2016 Referred By: Felipe Drone, NP Date of Birth:  [birth year]   Pregnancy History: [id number] Estimated Date of Delivery: 01/23/17 Estimated Gestational Age: [redacted]w[redacted]d Attending: Damaris Hippo, MD  Joanne Roberts. Joanne Roberts was seen for genetic counseling because of a maternal age of 36 y.o..     In summary:  Discussed AMA and associated risk for fetal aneuploidy  Discussed options for screening  NIPS- elected to pursue Panorama today  Ultrasound- performed today; see separate report  Discussed diagnostic testing options  Amniocentesis- declined  Reviewed family history concerns  She was counseled regarding maternal age and the association with risk for chromosome conditions due to nondisjunction with aging of the ova.   We reviewed chromosomes, nondisjunction, and the associated 1 in 141 risk for fetal aneuploidy related to a maternal age of 36 y.o. at [redacted]w[redacted]d gestation.  She was counseled that the risk for aneuploidy decreases as gestational age increases, accounting for those pregnancies which spontaneously abort.  We specifically discussed Down syndrome (trisomy 98), trisomies 1 and 72, and sex chromosome aneuploidies (47,XXX and 47,XXY) including the common features and prognoses of each.   We reviewed available screening options including noninvasive prenatal screening (NIPS)/cell free DNA (cfDNA) screening and detailed ultrasound.  She was counseled that screening tests are used to modify a patient's a priori risk for aneuploidy, typically based on age. This estimate provides a pregnancy specific risk assessment. We reviewed the benefits and limitations of each option. Specifically, we discussed the conditions for which each test screens, the detection rates, and false positive rates of each. She was also counseled regarding diagnostic testing via amniocentesis. We reviewed the approximate 1 in 300-500 risk for  complications from amniocentesis, including spontaneous pregnancy loss. We discussed the possible results that the tests might provide including: positive, negative, unanticipated, and no result. Finally, they were counseled regarding the cost of each option and potential out of pocket expenses.  After consideration of all the options, she elected to proceed with NIPS (Panorama through [facility]).  Those results will be available in 8-10 days.  She declined amniocentesis.   A complete ultrasound was performed today. The ultrasound report will be sent under separate cover. There were no visualized fetal anomalies or markers suggestive of aneuploidy. Diagnostic testing was declined today.  She understands that screening tests cannot rule out all birth defects or genetic syndromes. The patient was advised of this limitation and states she still does not want additional testing at this time.   Joanne Roberts. Joanne Roberts was provided with written information regarding cystic fibrosis (CF), spinal muscular atrophy (SMA) and hemoglobinopathies including the carrier frequency, availability of carrier screening and prenatal diagnosis if indicated.  In addition, we discussed that CF and hemoglobinopathies are routinely screened for as part of the [facility] newborn screening panel.  She declined screening for SMA. CF carrier screening and hemoglobin electrophoresis were drawn through her OB provider, per available medical records and are currently pending.   Both family histories were reviewed and found to be noncontributory for birth defects, intellectual disability, and known genetic conditions. Consanguinity was denied. Without further information regarding the provided family history, an accurate genetic risk cannot be calculated. Further genetic counseling is warranted if more information is obtained.  Joanne Roberts. Joanne Roberts denied exposure to environmental toxins or chemical agents. She denied the use of alcohol, tobacco  or street drugs. She denied significant viral illnesses during the course of her pregnancy. Her medical and  surgical histories were noncontributory.   I counseled Joanne Roberts. Joanne Roberts regarding the above risks and available options. Most of the counseling was provided by Joanne Roberts, Joanne Roberts, under my direct supervision. The approximate face-to-face time with the genetic counselor was 40 minutes.  Joanne Plowman, Joanne Roberts,  Certified Genetic Counselor 09/29/2016

## 2016-10-08 ENCOUNTER — Ambulatory Visit (INDEPENDENT_AMBULATORY_CARE_PROVIDER_SITE_OTHER): Payer: Medicaid Other | Admitting: Family Medicine

## 2016-10-08 ENCOUNTER — Encounter: Payer: Self-pay | Admitting: Family Medicine

## 2016-10-08 ENCOUNTER — Telehealth (HOSPITAL_COMMUNITY): Payer: Self-pay | Admitting: MS"

## 2016-10-08 VITALS — BP 111/58 | HR 72 | Wt 202.8 lb

## 2016-10-08 DIAGNOSIS — O0992 Supervision of high risk pregnancy, unspecified, second trimester: Secondary | ICD-10-CM | POA: Diagnosis not present

## 2016-10-08 DIAGNOSIS — O99012 Anemia complicating pregnancy, second trimester: Secondary | ICD-10-CM

## 2016-10-08 DIAGNOSIS — O24410 Gestational diabetes mellitus in pregnancy, diet controlled: Secondary | ICD-10-CM | POA: Diagnosis not present

## 2016-10-08 DIAGNOSIS — O09522 Supervision of elderly multigravida, second trimester: Secondary | ICD-10-CM

## 2016-10-08 DIAGNOSIS — O24419 Gestational diabetes mellitus in pregnancy, unspecified control: Secondary | ICD-10-CM | POA: Insufficient documentation

## 2016-10-08 DIAGNOSIS — O09529 Supervision of elderly multigravida, unspecified trimester: Secondary | ICD-10-CM

## 2016-10-08 HISTORY — DX: Gestational diabetes mellitus in pregnancy, unspecified control: O24.419

## 2016-10-08 LAB — POCT URINALYSIS DIP (DEVICE)
Bilirubin Urine: NEGATIVE
Glucose, UA: NEGATIVE mg/dL
HGB URINE DIPSTICK: NEGATIVE
KETONES UR: NEGATIVE mg/dL
Leukocytes, UA: NEGATIVE
Nitrite: NEGATIVE
Protein, ur: NEGATIVE mg/dL
SPECIFIC GRAVITY, URINE: 1.025 (ref 1.005–1.030)
UROBILINOGEN UA: 0.2 mg/dL (ref 0.0–1.0)
pH: 8 (ref 5.0–8.0)

## 2016-10-08 MED ORDER — ACCU-CHEK NANO SMARTVIEW W/DEVICE KIT: 1.0000 | PACK | 0 refills | Status: DC

## 2016-10-08 MED ORDER — ACCU-CHEK FASTCLIX LANCETS MISC: 1.0000 [IU] | Freq: Four times a day (QID) | 12 refills | Status: DC

## 2016-10-08 MED ORDER — FERROUS SULFATE 325 (65 FE) MG PO TABS: 325.0000 mg | ORAL_TABLET | Freq: Two times a day (BID) | ORAL | 11 refills | Status: DC

## 2016-10-08 MED ORDER — GLUCOSE BLOOD VI STRP: ORAL_STRIP | 12 refills | Status: DC

## 2016-10-08 NOTE — Telephone Encounter (Signed)
Attempted to contact patient regarding results of noninvasive prenatal screening (NIPS)/prenatal cell free DNA testing, which were within normal limits. Left message for patient to return call.  Joanne Roberts 10/08/2016 9:12 AM

## 2016-10-08 NOTE — Progress Notes (Signed)
1 meter, 2 boxes of testing strips and 1 box of lancets was given to pt on 10/08/16.

## 2016-10-08 NOTE — Progress Notes (Signed)
   PRENATAL VISIT NOTE  Subjective:  Joanne Roberts is a 36 y.o. [id number] at [redacted]w[redacted]d being seen today for initial prenatal care.  She started her care at the [facility]. Pregnancy complicated by AMA and GDM. Had NIPS - low risk. She is currently monitored for the following issues for this high-risk pregnancy and has Supervision of high risk pregnancy, antepartum, second trimester; Anemia complicating pregnancy; Advanced maternal age in multigravida; and Gestational diabetes mellitus (GDM), antepartum on her problem list.  Patient reports no complaints.  Contractions: Not present. Vag. Bleeding: None.  Movement: Present. Denies leaking of fluid.   The following portions of the patient's history were reviewed and updated as appropriate: allergies, current medications, past family history, past medical history, past social history, past surgical history and problem list. Problem list updated.  Objective:   Vitals:   10/08/16 1304  BP: (!) 111/58  Pulse: 72  Weight: 202 lb 12.8 oz (92 kg)    Fetal Status: Fetal Heart Rate (bpm): 135   Movement: Present     General:  Alert, oriented and cooperative. Patient is in no acute distress.  Skin: Skin is warm and dry. No rash noted.   Cardiovascular: Normal heart rate noted  Respiratory: Normal respiratory effort, no problems with respiration noted  Abdomen: Soft, gravid, appropriate for gestational age.  Pain/Pressure: Present     Pelvic: Cervical exam deferred        Extremities: Normal range of motion.  Edema: None  Mental Status:  Normal mood and affect. Normal behavior. Normal judgment and thought content.   Assessment and Plan:  Pregnancy: [id number] at [redacted]w[redacted]d  1. Supervision of high risk pregnancy, antepartum, second trimester FHT normal  2. Diet controlled gestational diabetes mellitus (GDM), antepartum Discussed serial [place], CBG goals, when to test, risks of GDM in pregnancy. - Hemoglobin A1c  3. Antepartum multigravida of advanced maternal  age NIPS - low risk  4. Anemia affecting pregnancy in second trimester Continue iron  Preterm labor symptoms and general obstetric precautions including but not limited to vaginal bleeding, contractions, leaking of fluid and fetal movement were reviewed in detail with the patient. Please refer to After Visit Summary for other counseling recommendations.  Return in about 1 week (around 10/15/2016) for HR OB f/u.   Levie Heritage, DO

## 2016-10-09 ENCOUNTER — Telehealth (HOSPITAL_COMMUNITY): Payer: Self-pay | Admitting: MS"

## 2016-10-09 ENCOUNTER — Other Ambulatory Visit: Payer: Self-pay

## 2016-10-09 LAB — HEMOGLOBIN A1C
ESTIMATED AVERAGE GLUCOSE: 134 mg/dL
Hgb A1c MFr Bld: 6.3 % — ABNORMAL HIGH (ref 4.8–5.6)

## 2016-10-09 NOTE — Telephone Encounter (Signed)
Called Rayel Mickel to discuss her prenatal cell free DNA test results.  Ms. Ladean Steinmeyer had Panorama testing through [place] [facility].  Testing was offered because of advanced maternal age.   The patient was identified by name and DOB.  We reviewed that these are within normal limits, showing a less than 1 in 10,000 risk for trisomies 21, 18 and 13, and monosomy X (Turner syndrome).  In addition, the risk for triploidy and sex chromosome trisomies (47,XXX and 47,XXY) was also low risk.  We reviewed that this testing identifies > 99% of pregnancies with trisomy 25, trisomy 22, sex chromosome trisomies (47,XXX and 47,XXY), and triploidy. The detection rate for trisomy 18 is 96%.  The detection rate for monosomy X is ~92%.  The false positive rate is <0.1% for all conditions. Testing was also consistent with female fetal sex.  She understands that this testing does not identify all genetic conditions.  All questions were answered to her satisfaction, she was encouraged to call with additional questions or concerns.  Quinn Plowman, MS Certified Genetic Counselor 10/09/2016 12:44 PM

## 2016-10-13 ENCOUNTER — Encounter: Payer: Self-pay | Admitting: Obstetrics and Gynecology

## 2016-10-13 DIAGNOSIS — E669 Obesity, unspecified: Secondary | ICD-10-CM | POA: Insufficient documentation

## 2016-10-13 DIAGNOSIS — O99210 Obesity complicating pregnancy, unspecified trimester: Secondary | ICD-10-CM | POA: Insufficient documentation

## 2016-10-14 ENCOUNTER — Other Ambulatory Visit: Payer: Self-pay

## 2016-10-15 ENCOUNTER — Ambulatory Visit (INDEPENDENT_AMBULATORY_CARE_PROVIDER_SITE_OTHER): Payer: Self-pay | Admitting: Obstetrics and Gynecology

## 2016-10-15 VITALS — BP 116/54 | HR 71 | Wt 205.0 lb

## 2016-10-15 DIAGNOSIS — O09522 Supervision of elderly multigravida, second trimester: Secondary | ICD-10-CM

## 2016-10-15 DIAGNOSIS — O24415 Gestational diabetes mellitus in pregnancy, controlled by oral hypoglycemic drugs: Secondary | ICD-10-CM

## 2016-10-15 DIAGNOSIS — O0992 Supervision of high risk pregnancy, unspecified, second trimester: Secondary | ICD-10-CM

## 2016-10-15 MED ORDER — GLYBURIDE 2.5 MG PO TABS: 2.5000 mg | ORAL_TABLET | Freq: Two times a day (BID) | ORAL | 3 refills | Status: DC

## 2016-10-15 NOTE — Progress Notes (Signed)
Subjective:  Joanne Roberts is a 36 y.o. [id number] at [redacted]w[redacted]d being seen today for ongoing prenatal care.  She is currently monitored for the following issues for this high-risk pregnancy and has Supervision of high risk pregnancy, antepartum, second trimester; Anemia complicating pregnancy; Advanced maternal age in multigravida; Gestational diabetes mellitus (GDM), antepartum; Obesity (BMI 30-39.9); and Obesity in pregnancy on her problem list.  Patient reports no complaints.  Contractions: Not present. Vag. Bleeding: None.  Movement: Present. Denies leaking of fluid.   The following portions of the patient's history were reviewed and updated as appropriate: allergies, current medications, past family history, past medical history, past social history, past surgical history and problem list. Problem list updated.  Objective:   Vitals:   10/15/16 1024  BP: (!) 116/54  Pulse: 71  Weight: 205 lb (93 kg)    Fetal Status: Fetal Heart Rate (bpm): 143   Movement: Present     General:  Alert, oriented and cooperative. Patient is in no acute distress.  Skin: Skin is warm and dry. No rash noted.   Cardiovascular: Normal heart rate noted  Respiratory: Normal respiratory effort, no problems with respiration noted  Abdomen: Soft, gravid, appropriate for gestational age. Pain/Pressure: Present     Pelvic:  Cervical exam deferred        Extremities: Normal range of motion.  Edema: Trace  Mental Status: Normal mood and affect. Normal behavior. Normal judgment and thought content.   Urinalysis:      Assessment and Plan:  Pregnancy: [id number] at [redacted]w[redacted]d  1. Supervision of high risk pregnancy, antepartum, second trimester Stable  2. Gestational diabetes mellitus (GDM) controlled on oral hypoglycemic drug, antepartum BS are not in goal range. Will start Diabeta - glyBURIDE (DIABETA) 2.5 MG tablet; Take 1 tablet (2.5 mg total) by mouth 2 (two) times daily with a meal.  Dispense: 60 tablet; Refill:  3  3. Elderly multigravida in second trimester Nl NIPS  Preterm labor symptoms and general obstetric precautions including but not limited to vaginal bleeding, contractions, leaking of fluid and fetal movement were reviewed in detail with the patient. Please refer to After Visit Summary for other counseling recommendations.  Return in about 2 weeks (around 10/29/2016) for OB visit.   Hermina Staggers, MD

## 2016-10-15 NOTE — Progress Notes (Signed)
Educated pt on [place] 

## 2016-10-15 NOTE — Patient Instructions (Addendum)
Second Trimester of Pregnancy The second trimester is from week 14 through week 27 (months 4 through 6). The second trimester is often a time when you feel your best. Your body has adjusted to being pregnant, and you begin to feel better physically. Usually, morning sickness has lessened or quit completely, you may have more energy, and you may have an increase in appetite. The second trimester is also a time when the fetus is growing rapidly. At the end of the sixth month, the fetus is about 9 inches long and weighs about 1 pounds. You will likely begin to feel the baby move (quickening) between 16 and 20 weeks of pregnancy. Body changes during your second trimester Your body continues to go through many changes during your second trimester. The changes vary from woman to woman.  Your weight will continue to increase. You will notice your lower abdomen bulging out.  You may begin to get stretch marks on your hips, abdomen, and breasts.  You may develop headaches that can be relieved by medicines. The medicines should be approved by your health care provider.  You may urinate more often because the fetus is pressing on your bladder.  You may develop or continue to have heartburn as a result of your pregnancy.  You may develop constipation because certain hormones are causing the muscles that push waste through your intestines to slow down.  You may develop hemorrhoids or swollen, bulging veins (varicose veins).  You may have back pain. This is caused by: ? Weight gain. ? Pregnancy hormones that are relaxing the joints in your pelvis. ? A shift in weight and the muscles that support your balance.  Your breasts will continue to grow and they will continue to become tender.  Your gums may bleed and may be sensitive to brushing and flossing.  Dark spots or blotches (chloasma, mask of pregnancy) may develop on your face. This will likely fade after the baby is born.  A dark line from your  belly button to the pubic area (linea nigra) may appear. This will likely fade after the baby is born.  You may have changes in your hair. These can include thickening of your hair, rapid growth, and changes in texture. Some women also have hair loss during or after pregnancy, or hair that feels dry or thin. Your hair will most likely return to normal after your baby is born.  What to expect at prenatal visits During a routine prenatal visit:  You will be weighed to make sure you and the fetus are growing normally.  Your blood pressure will be taken.  Your abdomen will be measured to track your baby's growth.  The fetal heartbeat will be listened to.  Any test results from the previous visit will be discussed.  Your health care provider may ask you:  How you are feeling.  If you are feeling the baby move.  If you have had any abnormal symptoms, such as leaking fluid, bleeding, severe headaches, or abdominal cramping.  If you are using any tobacco products, including cigarettes, chewing tobacco, and electronic cigarettes.  If you have any questions.  Other tests that may be performed during your second trimester include:  Blood tests that check for: ? Low iron levels (anemia). ? High blood sugar that affects pregnant women (gestational diabetes) between 24 and 28 weeks. ? Rh antibodies. This is to check for a protein on red blood cells (Rh factor).  Urine tests to check for infections, diabetes, or   protein in the urine.  An ultrasound to confirm the proper growth and development of the baby.  An amniocentesis to check for possible genetic problems.  Fetal screens for spina bifida and Down syndrome.  HIV (human immunodeficiency virus) testing. Routine prenatal testing includes screening for HIV, unless you choose not to have this test.  Follow these instructions at home: Medicines  Follow your health care provider's instructions regarding medicine use. Specific  medicines may be either safe or unsafe to take during pregnancy.  Take a prenatal vitamin that contains at least 600 micrograms (mcg) of folic acid.  If you develop constipation, try taking a stool softener if your health care provider approves. Eating and drinking  Eat a balanced diet that includes fresh fruits and vegetables, whole grains, good sources of protein such as meat, eggs, or tofu, and low-fat dairy. Your health care provider will help you determine the amount of weight gain that is right for you.  Avoid raw meat and uncooked cheese. These carry germs that can cause birth defects in the baby.  If you have low calcium intake from food, talk to your health care provider about whether you should take a daily calcium supplement.  Limit foods that are high in fat and processed sugars, such as fried and sweet foods.  To prevent constipation: ? Drink enough fluid to keep your urine clear or pale yellow. ? Eat foods that are high in fiber, such as fresh fruits and vegetables, whole grains, and beans. Activity  Exercise only as directed by your health care provider. Most women can continue their usual exercise routine during pregnancy. Try to exercise for 30 minutes at least 5 days a week. Stop exercising if you experience uterine contractions.  Avoid heavy lifting, wear low heel shoes, and practice good posture.  A sexual relationship may be continued unless your health care provider directs you otherwise. Relieving pain and discomfort  Wear a good support bra to prevent discomfort from breast tenderness.  Take warm sitz baths to soothe any pain or discomfort caused by hemorrhoids. Use hemorrhoid cream if your health care provider approves.  Rest with your legs elevated if you have leg cramps or low back pain.  If you develop varicose veins, wear support hose. Elevate your feet for 15 minutes, 3-4 times a day. Limit salt in your diet. Prenatal Care  Write down your questions.  Take them to your prenatal visits.  Keep all your prenatal visits as told by your health care provider. This is important. Safety  Wear your seat belt at all times when driving.  Make a list of emergency phone numbers, including numbers for family, friends, the hospital, and police and fire departments. General instructions  Ask your health care provider for a referral to a local prenatal education class. Begin classes no later than the beginning of month 6 of your pregnancy.  Ask for help if you have counseling or nutritional needs during pregnancy. Your health care provider can offer advice or refer you to specialists for help with various needs.  Do not use hot tubs, steam rooms, or saunas.  Do not douche or use tampons or scented sanitary pads.  Do not cross your legs for long periods of time.  Avoid cat litter boxes and soil used by cats. These carry germs that can cause birth defects in the baby and possibly loss of the fetus by miscarriage or stillbirth.  Avoid all smoking, herbs, alcohol, and unprescribed drugs. Chemicals in these products can   affect the formation and growth of the baby.  Do not use any products that contain nicotine or tobacco, such as cigarettes and e-cigarettes. If you need help quitting, ask your health care provider.  Visit your dentist if you have not gone yet during your pregnancy. Use a soft toothbrush to brush your teeth and be gentle when you floss. Contact a health care provider if:  You have dizziness.  You have mild pelvic cramps, pelvic pressure, or nagging pain in the abdominal area.  You have persistent nausea, vomiting, or diarrhea.  You have a bad smelling vaginal discharge.  You have pain when you urinate. Get help right away if:  You have a fever.  You are leaking fluid from your vagina.  You have spotting or bleeding from your vagina.  You have severe abdominal cramping or pain.  You have rapid weight gain or weight  loss.  You have shortness of breath with chest pain.  You notice sudden or extreme swelling of your face, hands, ankles, feet, or legs.  You have not felt your baby move in over an hour.  You have severe headaches that do not go away when you take medicine.  You have vision changes. Summary  The second trimester is from week 14 through week 27 (months 4 through 6). It is also a time when the fetus is growing rapidly.  Your body goes through many changes during pregnancy. The changes vary from woman to woman.  Avoid all smoking, herbs, alcohol, and unprescribed drugs. These chemicals affect the formation and growth your baby.  Do not use any tobacco products, such as cigarettes, chewing tobacco, and e-cigarettes. If you need help quitting, ask your health care provider.  Contact your health care provider if you have any questions. Keep all prenatal visits as told by your health care provider. This is important. This information is not intended to replace advice given to you by your health care provider. Make sure you discuss any questions you have with your health care provider. Document Released: 12/23/2000 Document Revised: 06/06/2015 Document Reviewed: 03/01/2012 Elsevier Interactive Patient Education  2017 [employer]  Diabetes Mellitus and Exercise Exercising regularly is important for your overall health, especially when you have diabetes (diabetes mellitus). Exercising is not only about losing weight. It has many health benefits, such as increasing muscle strength and bone density and reducing body fat and stress. This leads to improved fitness, flexibility, and endurance, all of which result in better overall health. Exercise has additional benefits for people with diabetes, including:  Reducing appetite.  Helping to lower and control blood glucose.  Lowering blood pressure.  Helping to control amounts of fatty substances (lipids) in the blood, such as cholesterol and  triglycerides.  Helping the body to respond better to insulin (improving insulin sensitivity).  Reducing how much insulin the body needs.  Decreasing the risk for heart disease by: ? Lowering cholesterol and triglyceride levels. ? Increasing the levels of good cholesterol. ? Lowering blood glucose levels.  What is my activity plan? Your health care provider or certified diabetes educator can help you make a plan for the type and frequency of exercise (activity plan) that works for you. Make sure that you:  Do at least 150 minutes of moderate-intensity or vigorous-intensity exercise each week. This could be brisk walking, biking, or water aerobics. ? Do stretching and strength exercises, such as yoga or weightlifting, at least 2 times a week. ? Spread out your activity over at least 3  days of the week.  Get some form of physical activity every day. ? Do not go more than 2 days in a row without some kind of physical activity. ? Avoid being inactive for more than 90 minutes at a time. Take frequent breaks to walk or stretch.  Choose a type of exercise or activity that you enjoy, and set realistic goals.  Start slowly, and gradually increase the intensity of your exercise over time.  What do I need to know about managing my diabetes?  Check your blood glucose before and after exercising. ? If your blood glucose is higher than 240 mg/dL (16.1 mmol/L) before you exercise, check your urine for ketones. If you have ketones in your urine, do not exercise until your blood glucose returns to normal.  Know the symptoms of low blood glucose (hypoglycemia) and how to treat it. Your risk for hypoglycemia increases during and after exercise. Common symptoms of hypoglycemia can include: ? Hunger. ? Anxiety. ? Sweating and feeling clammy. ? Confusion. ? Dizziness or feeling light-headed. ? Increased heart rate or palpitations. ? Blurry vision. ? Tingling or numbness around the mouth, lips, or  tongue. ? Tremors or shakes. ? Irritability.  Keep a rapid-acting carbohydrate snack available before, during, and after exercise to help prevent or treat hypoglycemia.  Avoid injecting insulin into areas of the body that are going to be exercised. For example, avoid injecting insulin into: ? The arms, when playing tennis. ? The legs, when jogging.  Keep records of your exercise habits. Doing this can help you and your health care provider adjust your diabetes management plan as needed. Write down: ? Food that you eat before and after you exercise. ? Blood glucose levels before and after you exercise. ? The type and amount of exercise you have done. ? When your insulin is expected to peak, if you use insulin. Avoid exercising at times when your insulin is peaking.  When you start a new exercise or activity, work with your health care provider to make sure the activity is safe for you, and to adjust your insulin, medicines, or food intake as needed.  Drink plenty of water while you exercise to prevent dehydration or heat stroke. Drink enough fluid to keep your urine clear or pale yellow. This information is not intended to replace advice given to you by your health care provider. Make sure you discuss any questions you have with your health care provider. Document Released: 03/21/2003 Document Revised: 07/19/2015 Document Reviewed: 06/10/2015 Elsevier Interactive Patient Education  2018 [employer].  Gestational Diabetes Mellitus, Diagnosis Gestational diabetes (gestational diabetes mellitus) is a temporary form of diabetes that some women develop during pregnancy. It usually occurs around weeks 24-28 of pregnancy and goes away after delivery. Hormonal changes during pregnancy can interfere with insulin production and function, which may result in one or both of these problems:  The pancreas does not make enough of a hormone called insulin.  Cells in the body do not respond properly to  insulin that the body makes (insulin resistance).  Normally, insulin allows sugars (glucose) to enter cells in the body. The cells use glucose for energy. Insulin resistance or lack of insulin causes excess glucose to build up in the blood instead of going into cells. As a result, high blood glucose (hyperglycemia) develops. What are the risks? If gestational diabetes is treated, it is unlikely to cause problems. If it is not controlled with treatment, it may cause problems during labor and delivery, and  some of those problems can be harmful to the unborn baby (fetus) and the mother. Uncontrolled gestational diabetes may also cause the newborn baby to have breathing problems and low blood glucose. Women who get gestational diabetes are more likely to develop it if they get pregnant again, and they are more likely to develop type 2 diabetes in the future. What increases the risk? This condition may be more likely to develop in pregnant women who:  Are older than age 30 during pregnancy.  Have a family history of diabetes.  Are overweight.  Had gestational diabetes in the past.  Have polycystic ovarian syndrome (PCOS).  Are pregnant with twins or multiples.  Are of American-Indian, African-American, Hispanic/Latino, or Asian/Pacific Islander descent.  What are the signs or symptoms? Most women do not notice symptoms of gestational diabetes because the symptoms are similar to normal symptoms of pregnancy. Symptoms of gestational diabetes may include:  Increased thirst (polydipsia).  Increased hunger(polyphagia).  Increased urination (polyuria).  How is this diagnosed?  This condition may be diagnosed based on your blood glucose level, which may be checked with one or more of the following blood tests:  A fasting blood glucose (FBG) test. You will not be allowed to eat (you will fast) for at least 8 hours before a blood sample is taken.  A random blood glucose test. This checks  your blood glucose at any time of day regardless of when you ate.  An oral glucose tolerance test (OGTT). This is usually done during weeks 24-28 of pregnancy. ? For this test, you will have an FBG test done. Then, you will drink a beverage that contains glucose. Your blood glucose will be tested again 1 hour after drinking the glucose beverage (1-hour OGTT). ? If the 1-hour OGTT result is at or above 140 mg/dL (7.8 mmol/L), you will repeat the OGTT. This time, your blood glucose will be tested 3 hours after drinking the glucose beverage (3-hour OGTT).  If you have risk factors, you may be screened for undiagnosed type 2 diabetes at your first health care visit during your pregnancy (prenatal visit). How is this treated?  Your treatment may be managed by a specialist called an endocrinologist. This condition is treated by following instructions from your health care provider about:  Eating a healthier diet and getting more physical activity. These changes are the most important ways to manage gestational diabetes.  Checking your blood glucose. Do this as often as told.  Taking diabetes medicines or insulin every day. These will only be prescribed if they are needed. ? If you use insulin, you may need to adjust your dosage based on how physically active you are and what foods you eat. Your health care provider will tell you how to do this.  Your health care provider will set treatment goals for you based on the stage of your pregnancy and any other medical conditions you have. Generally, the goal of treatment is to maintain the following blood glucose levels during pregnancy:  Fasting: at or below 95 mg/dL (5.3 mmol/L).  After meals (postprandial): ? One hour after a meal: at or below 140 mg/dL (7.8 mmol/L). ? Two hours after a meal: at or below 120 mg/dL (6.7 mmol/L).  A1c (hemoglobin A1c) level: 6-6.5%.  Follow these instructions at home:  Take over-the-counter and prescription  medicines only as told by your health care provider.  Manage your weight gain during pregnancy. The amount of weight that you are expected to gain depends on  your pre-pregnancy BMI (body mass index).  Keep all follow-up visits as told by your health care provider. This is important. Consider asking your health care provider these questions:   Do I need to meet with a diabetes educator?  Where can I find a support group for people with diabetes?  What equipment will I need to manage my diabetes at home?  What diabetes medicines do I need, and when should I take them?  How often do I need to check my blood glucose?  What number can I call if I have questions?  When is my next appointment? Where to find more information:  For more information about diabetes, visit: ? American Diabetes Association (ADA): www.diabetes.org ? American Association of Diabetes Educators (AADE): www.diabeteseducator.org/patient-resources Contact a health care provider if:  Your blood glucose level is at or above 240 mg/dL (16.1 mmol/L).  Your blood glucose level is at or above 200 mg/dL (09.6 mmol/L) and you have ketones in your urine.  You have been sick or have had a fever for 2 days or more and you are not getting better.  You have any of the following problems for more than 6 hours: ? You cannot eat or drink. ? You have nausea and vomiting. ? You have diarrhea. Get help right away if:  Your blood glucose is below 54 mg/dL (3 mmol/L).  You become confused or you have trouble thinking clearly.  You have difficulty breathing.  You have moderate or large ketone levels in your urine.  Your baby is moving around less than usual.  You develop unusual discharge or bleeding from your vagina.  You start having contractions early (prematurely). Contractions may feel like a tightening in your lower abdomen. This information is not intended to replace advice given to you by your health care  provider. Make sure you discuss any questions you have with your health care provider. Document Released: 04/06/2000 Document Revised: 06/06/2015 Document Reviewed: 02/01/2015 Elsevier Interactive Patient Education  2017 [employer].

## 2016-10-21 ENCOUNTER — Inpatient Hospital Stay (HOSPITAL_COMMUNITY)
Admission: AD | Admit: 2016-10-21 | Discharge: 2016-10-21 | Disposition: A | Discharge status: Home / Self Care | Payer: Self-pay | Source: Ambulatory Visit | Attending: Obstetrics and Gynecology | Admitting: Obstetrics and Gynecology

## 2016-10-21 ENCOUNTER — Encounter (HOSPITAL_COMMUNITY): Payer: Self-pay

## 2016-10-21 DIAGNOSIS — O99212 Obesity complicating pregnancy, second trimester: Secondary | ICD-10-CM | POA: Insufficient documentation

## 2016-10-21 DIAGNOSIS — O24419 Gestational diabetes mellitus in pregnancy, unspecified control: Secondary | ICD-10-CM | POA: Insufficient documentation

## 2016-10-21 DIAGNOSIS — Z3A26 26 weeks gestation of pregnancy: Secondary | ICD-10-CM | POA: Insufficient documentation

## 2016-10-21 DIAGNOSIS — R102 Pelvic and perineal pain unspecified side: Secondary | ICD-10-CM

## 2016-10-21 DIAGNOSIS — O26892 Other specified pregnancy related conditions, second trimester: Secondary | ICD-10-CM | POA: Insufficient documentation

## 2016-10-21 DIAGNOSIS — N949 Unspecified condition associated with female genital organs and menstrual cycle: Secondary | ICD-10-CM

## 2016-10-21 DIAGNOSIS — O26899 Other specified pregnancy related conditions, unspecified trimester: Secondary | ICD-10-CM

## 2016-10-21 DIAGNOSIS — O09522 Supervision of elderly multigravida, second trimester: Secondary | ICD-10-CM

## 2016-10-21 DIAGNOSIS — N898 Other specified noninflammatory disorders of vagina: Secondary | ICD-10-CM

## 2016-10-21 LAB — URINALYSIS, ROUTINE W REFLEX MICROSCOPIC
BILIRUBIN URINE: NEGATIVE
Bacteria, UA: NONE SEEN
Glucose, UA: 150 mg/dL — AB
HGB URINE DIPSTICK: NEGATIVE
Ketones, ur: NEGATIVE mg/dL
NITRITE: NEGATIVE
PH: 6 (ref 5.0–8.0)
Protein, ur: 30 mg/dL — AB
SPECIFIC GRAVITY, URINE: 1.027 (ref 1.005–1.030)

## 2016-10-21 LAB — WET PREP, GENITAL
Clue Cells Wet Prep HPF POC: NONE SEEN
SPERM: NONE SEEN
TRICH WET PREP: NONE SEEN
Yeast Wet Prep HPF POC: NONE SEEN

## 2016-10-21 MED ORDER — COMFORT FIT MATERNITY SUPP LG MISC
0 refills | Status: DC
Start: 2016-10-21 — End: 2017-01-07

## 2016-10-21 NOTE — MAU Note (Signed)
Pt says she has been having pelvic pain and feeling popping. Feeling pressure in her pelvis.

## 2016-10-21 NOTE — Discharge Instructions (Signed)
Round Ligament Pain The round ligament is a cord of muscle and tissue that helps to support the uterus. It can become a source of pain during pregnancy if it becomes stretched or twisted as the baby grows. The pain usually begins in the second trimester of pregnancy, and it can come and go until the baby is delivered. It is not a serious problem, and it does not cause harm to the baby. Round ligament pain is usually a short, sharp, and pinching pain, but it can also be a dull, lingering, and aching pain. The pain is felt in the lower side of the abdomen or in the groin. It usually starts deep in the groin and moves up to the outside of the hip area. Pain can occur with:  A sudden change in position.  Rolling over in bed.  Coughing or sneezing.  Physical activity.  Follow these instructions at home: Watch your condition for any changes. Take these steps to help with your pain:  When the pain starts, relax. Then try: ? Sitting down. ? Flexing your knees up to your abdomen. ? Lying on your side with one pillow under your abdomen and another pillow between your legs. ? Sitting in a warm bath for 15-20 minutes or until the pain goes away.  Take over-the-counter and prescription medicines only as told by your health care provider.  Move slowly when you sit and stand.  Avoid long walks if they cause pain.  Stop or lessen your physical activities if they cause pain.  Contact a health care provider if:  Your pain does not go away with treatment.  You feel pain in your back that you did not have before.  Your medicine is not helping. Get help right away if:  You develop a fever or chills.  You develop uterine contractions.  You develop vaginal bleeding.  You develop nausea or vomiting.  You develop diarrhea.  You have pain when you urinate. This information is not intended to replace advice given to you by your health care provider. Make sure you discuss any questions you have  with your health care provider. Document Released: 10/08/2007 Document Revised: 06/06/2015 Document Reviewed: 03/07/2014 Elsevier Interactive Patient Education  2018 [employer] Inc.   Abdominal Pain During Pregnancy Belly (abdominal) pain is common during pregnancy. Most of the time, it is not a serious problem. Other times, it can be a sign that something is wrong with the pregnancy. Always tell your doctor if you have belly pain. Follow these instructions at home: Monitor your belly pain for any changes. The following actions may help you feel better: Do not have sex (intercourse) or put anything in your vagina until you feel better. Rest until your pain stops. Drink clear fluids if you feel sick to your stomach (nauseous). Do not eat solid food until you feel better. Only take medicine as told by your doctor. Keep all doctor visits as told.  Get help right away if: You are bleeding, leaking fluid, or pieces of tissue come out of your vagina. You have more pain or cramping. You keep throwing up (vomiting). You have pain when you pee (urinate) or have blood in your pee. You have a fever. You do not feel your baby moving as much. You feel very weak or feel like passing out. You have trouble breathing, with or without belly pain. You have a very bad headache and belly pain. You have fluid leaking from your vagina and belly pain. You keep having  watery poop (diarrhea). Your belly pain does not go away after resting, or the pain gets worse. This information is not intended to replace advice given to you by your health care provider. Make sure you discuss any questions you have with your health care provider. Document Released: 12/17/2008 Document Revised: 08/07/2015 Document Reviewed: 07/28/2012 Elsevier Interactive Patient Education  [employer].

## 2016-10-21 NOTE — MAU Note (Signed)
+  lower left sharp abdominal pains Intermittent Rating pain 7/10   Lower back pain Pressure rating 6/10 Intermittent  Denies vaginal bleeding or LOF.   +FM

## 2016-10-21 NOTE — MAU Provider Note (Signed)
Chief Complaint:  Abdominal Pain and Back Pain   None     HPI: Joanne Roberts is a 36 y.o. [id number] at 60w4dwho presents to MAU reporting sharp pelvic pain and increased pressure. Also endorsing some midline lower back pain. Pain is intermittent and worsened by position changes. She states that feels as though her bones are popping. Pressure increased on Sunday. Rates discomfort 6-7.   Denies contractions, leakage of fluid, vaginal discharge, or vaginal bleeding. Good fetal movement.   Pregnancy Course:  [facility] and [facility]. Pregnancy complicated by A1 GDM, AMA Obesity  Past Medical History: Past Medical History:  Diagnosis Date  . Anemia   . Gestational diabetes     Past obstetric history: OB History  Gravida Para Term Preterm AB Living  '5 3 3 '$ 0 1 3  SAB TAB Ectopic Multiple Live Births  0 1 0 0 3    # Outcome Date GA Lbr Len/2nd Weight Sex Delivery Anes PTL Lv  5 Current           4 Term 07/29/11 319w1d0:30 / 00:05 3.55 kg (7 lb 13.2 oz) F Vag-Spont None  LIV     Complications: Gestational diabetes  3 Term 2007 4061w0d.969 kg (8 lb 12 oz) F Vag-Spont None  LIV  2 Term 2004 40w10w0d118 kg (6 lb 14 oz) M Vag-Spont None  LIV  1 TAB               Past Surgical History: Past Surgical History:  Procedure Laterality Date  . NO PAST SURGERIES       Family History: Family History  Problem Relation Age of Onset  . Diabetes Father     Social History: Social History  Substance Use Topics  . Smoking status: Never Smoker  . Smokeless tobacco: Never Used  . Alcohol use No    Allergies: No Known Allergies  Meds:  Prescriptions Prior to Admission  Medication Sig Dispense Refill Last Dose  . ACCU-CHEK FASTCLIX LANCETS MISC 1 Units by Percutaneous route 4 (four) times daily. 100 each 12 Taking  . Blood Glucose Monitoring Suppl (ACCU-CHEK NANO SMARTVIEW) w/Device KIT 1 kit by Subdermal route as  directed. Check blood sugars for fasting, and two hours after breakfast, lunch and dinner (4 checks daily) 1 kit 0 Taking  . ferrous sulfate 325 (65 FE) MG tablet Take 1 tablet (325 mg total) by mouth 2 (two) times daily with a meal. 60 tablet 11 Taking  . glucose blood (ACCU-CHEK SMARTVIEW) test strip Use as instructed to check blood sugars 100 each 12 Taking  . glyBURIDE (DIABETA) 2.5 MG tablet Take 1 tablet (2.5 mg total) by mouth 2 (two) times daily with a meal. 60 tablet 3   . Prenatal Vit-Fe Fumarate-FA (PRENATAL MULTIVITAMIN) TABS Take 1 tablet by mouth every evening.   Taking    I have reviewed patient's Past Medical Hx, Surgical Hx, Family Hx, Social Hx, medications and allergies.   ROS:  All systems reviewed and are negative for acute change except as noted in the HPI.   Physical Exam  Patient Vitals for the past 24 hrs:  BP Temp Temp src Pulse Resp SpO2 Weight  10/21/16 1738 (!) 109/55 98.1 F (36.7 C) Oral 64 16 100 % 93.5 kg (206 lb 0.6 oz)   Constitutional: Well-developed, well-nourished obese female in no acute distress.  Cardiovascular: normal rate and rhythm, pulses intact Respiratory: normal  rate and effort.  GI: Abd soft, non-tender, gravid appropriate for gestational age.  MS: Extremities nontender, no edema, normal ROM Neurologic: Alert and oriented x 4. Strength and sensation intact. GU: Neg CVAT. Pelvic: NEFG, physiologic discharge, no blood, cervix clean. No CMT Psych: normal mood and affect  Dilation: Fingertip Effacement (%): Thick Cervical Position: Posterior Exam by:: J Phelps  Labs: Results for orders placed or performed during the hospital encounter of 10/21/16 (from the past 24 hour(s))  Urinalysis, Routine w reflex microscopic     Status: Abnormal   Collection Time: 10/21/16  5:39 PM  Result Value Ref Range   Color, Urine YELLOW YELLOW   APPearance HAZY (A) CLEAR   Specific Gravity, Urine 1.027 1.005 - 1.030   pH 6.0 5.0 - 8.0   Glucose, UA  150 (A) NEGATIVE mg/dL   Hgb urine dipstick NEGATIVE NEGATIVE   Bilirubin Urine NEGATIVE NEGATIVE   Ketones, ur NEGATIVE NEGATIVE mg/dL   Protein, ur 30 (A) NEGATIVE mg/dL   Nitrite NEGATIVE NEGATIVE   Leukocytes, UA TRACE (A) NEGATIVE   RBC / HPF 0-5 0 - 5 RBC/hpf   WBC, UA 0-5 0 - 5 WBC/hpf   Bacteria, UA NONE SEEN NONE SEEN   Squamous Epithelial / LPF 6-30 (A) NONE SEEN   Mucus PRESENT     Imaging:  [place] Mfm Ob Detail +14 Wk  Result Date: 09/29/2016 ----------------------------------------------------------------------  OBSTETRICS REPORT                      (Signed Final 09/29/2016 10:49 am) ---------------------------------------------------------------------- Patient Info  ID #:       [id number]                         D.O.B.:   11-Apr-1980 (36 yrs)  Name:       Joanne Roberts                 Visit Date:  09/29/2016 09:48 am ---------------------------------------------------------------------- Performed By  Performed By:     Rodrigo Ran BS      Ref. Address:     [facility]                                                             [facility]                                                             [place], [place]  Attending:        Oralia Rud       Location:         [facility]                    MD  Referred By:      [facility] for                    [place] ---------------------------------------------------------------------- Orders   #  Description                                 Code   1  [place] MFM OB DETAIL +14 [place]                     78676.72  ----------------------------------------------------------------------   #  Ordered By               Order #        Accession #    Episode  #   1  Roselee Nova                [id number]       [id number]     [id number]      Evaro  ---------------------------------------------------------------------- Indications   [redacted] weeks gestation of pregnancy                Z3A.40   Advanced maternal age multigravida 28+,        O48.522   second trimester   Gestational diabetes in pregnancy, diet        O24.410   controlled   Obesity complicating pregnancy, second         O99.212   trimester   Encounter for fetal anatomic survey            Z36.89   Encounter for uncertain dates                  Z36.87  ---------------------------------------------------------------------- OB History  Gravidity:    5         Term:   3        Prem:   0        SAB:   0  TOP:          1       Ectopic:  0        Living: 3 ---------------------------------------------------------------------- Fetal Evaluation  Num Of Fetuses:     1  Fetal Heart         137  Rate(bpm):  Cardiac Activity:   Observed  Presentation:       Transverse, head to maternal left  Placenta:           Posterior Fundal, above cervical os  P. Cord Insertion:  Visualized  Amniotic  Fluid  AFI FV:      Subjectively within normal limits                              Largest Pocket(cm)                              6.11 ---------------------------------------------------------------------- Biometry  BPD:      55.1  mm     G. Age:  22w 6d         22  %    CI:        68.44   %   70 - 86                                                          FL/HC:      18.4   %   19.2 - 20.8  HC:      212.9  mm     G. Age:  23w 3d         19  %    HC/AC:      1.05       1.05 - 1.21  AC:      203.5  mm     G. Age:  25w 0d         84  %    FL/BPD:     71.0   %   71 - 87  FL:       39.1  mm     G. Age:  22w 4d         15  %    FL/AC:      19.2   %   20 - 24  HUM:      36.4  mm     G. Age:  22w 5d         26  %  CER:      25.8  mm     G. Age:  23w 6d         55  %  CM:        5.2  mm  Est. FW:     631  gm      1 lb 6 oz     59  %  ---------------------------------------------------------------------- Gestational Age  LMP:           28w 2d       Date:   03/15/16                 EDD:   12/20/16  U/S Today:     23w 3d                                        EDD:   01/23/17  Best:          23w 3d    Det. By:   U/S (09/29/16)           EDD:   01/23/17 ---------------------------------------------------------------------- Anatomy  Cranium:               Appears normal  Aortic Arch:            Appears normal  Cavum:                 Appears normal         Ductal Arch:            Appears normal  Ventricles:            Appears normal         Diaphragm:              Appears normal  Choroid Plexus:        Appears normal         Stomach:                Appears normal, left                                                                        sided  Cerebellum:            Appears normal         Abdomen:                Appears normal  Posterior Fossa:       Appears normal         Abdominal Wall:         Appears nml (cord                                                                        insert, abd wall)  Nuchal Fold:           Not applicable (>05    Cord Vessels:           Appears normal ([redacted]                         wks GA)                                        vessel cord)  Face:                  Appears normal         Kidneys:                Appear normal                         (orbits and profile)  Lips:                  Appears normal         Bladder:                Appears normal  Thoracic:              Appears normal  Spine:                  Appears normal  Heart:                 Appears normal         Upper Extremities:      Appears normal                         (4CH, axis, and                         situs)  RVOT:                  Appears normal         Lower Extremities:      Appears normal  LVOT:                  Appears normal  Other:  Fetus appears to be a female. Heels and left 5th digit visualized. Nasal          bone  visualized. ---------------------------------------------------------------------- Cervix Uterus Adnexa  Cervix  Normal appearance by transabdominal scan.  Uterus  No abnormality visualized.  Left Ovary  Within normal limits.  Right Ovary  Within normal limits.  Cul De Sac:   No free fluid seen.  Adnexa:       No abnormality visualized. ---------------------------------------------------------------------- Impression  SIUP at [redacted]w[redacted]d gestational diabetes, obesity, AMA  active singleton fetus  no dysmorphic features seen  dating by today's ultrasound generates EDC of 01/23/17  EFW 59th%'le  no previa  cervix is long and closed ---------------------------------------------------------------------- Recommendations  Interval growth monthly  See genetic counseling (separate encounter/note). ----------------------------------------------------------------------              JRoselyn Bering MD Electronically Signed Final Report   09/29/2016 10:49 am ----------------------------------------------------------------------   MAU Course: Vitals and nursing notes reviewed I have ordered labs and reviewed them Wet prep normal Gc/Chl cultures collected UA with glucose but no signs of UTI Treatments given in MAU: Declined pain medication  I personally reviewed the patient's NST today, found to be REACTIVE. 130 bpm, mod var, +accels, no decels. CTX: none.   MDM: Plan of care reviewed with patient, including labs and tests ordered and medical treatment.   Assessment: 1. Round ligament pain   2. Elderly multigravida in second trimester   3. Pelvic pressure in pregnancy   4. Vaginal discharge     Plan: Discharge home in stable condition.  Counseled on round ligament and pelvic pressure from pregnancy and conservatives measures to help with discomfort Rx for comfort support belt given Preterm labor precautions and fetal kick counts reviewed Handout given Follow-up with OB provider   JLuiz Blare DSpanish Springsfor WMcl[facility]10/10/2016 7:47 PM

## 2016-10-22 ENCOUNTER — Encounter: Payer: Self-pay | Attending: Obstetrics & Gynecology | Admitting: *Deleted

## 2016-10-22 ENCOUNTER — Ambulatory Visit: Payer: Self-pay | Admitting: *Deleted

## 2016-10-22 DIAGNOSIS — O24414 Gestational diabetes mellitus in pregnancy, insulin controlled: Secondary | ICD-10-CM | POA: Insufficient documentation

## 2016-10-22 DIAGNOSIS — Z3A00 Weeks of gestation of pregnancy not specified: Secondary | ICD-10-CM | POA: Insufficient documentation

## 2016-10-22 DIAGNOSIS — Z713 Dietary counseling and surveillance: Secondary | ICD-10-CM | POA: Insufficient documentation

## 2016-10-22 DIAGNOSIS — R7309 Other abnormal glucose: Secondary | ICD-10-CM

## 2016-10-22 LAB — GC/CHLAMYDIA PROBE AMP (~~LOC~~) NOT AT ARMC
Chlamydia: NEGATIVE
Neisseria Gonorrhea: NEGATIVE

## 2016-10-22 NOTE — Progress Notes (Signed)
  Patient was seen on 10/22/2016 for Gestational Diabetes self-management . Patient states history of GDM with last pregnancy 5 years ago. She obtained a True Track meter here 2 weeks ago and brought BG data sheet today. All BG numbers are above target ranges including severag in the 200-300's. She was prescribed Glyburide but states she has not picked it up yet as her Medicaid is not covering her expenses and she doesn't know what it will cost. She states she has been trying to "eat right" but doesn't know why her BG are still too high. I informed Dr. Roselie Awkward and he spoke with patient about the concerns of her elevated BG. The following learning objectives were met by the patient :   States the definition of Gestational Diabetes  States why dietary management is important in controlling blood glucose  Describes the effects of carbohydrates on blood glucose levels  Demonstrates ability to create a balanced meal plan  Demonstrates carbohydrate counting   States when to check blood glucose levels  Demonstrates proper blood glucose monitoring techniques  States the effect of stress and exercise on blood glucose levels  States the importance of limiting caffeine and abstaining from alcohol and smoking  States understanding why she needs to pick up her Rx and start taking it right away.   Dr. Roselie Awkward explained to patient that she will probably need insulin but we will see how the Glyburide works this week along with new meal plan  Plan:  Aim for 3 Carb Choices per meal (45 grams) +/- 1 either way  Aim for 1-2 Carbs per snack Begin reading food labels for Total Carbohydrate of foods Consider  increasing your activity level by walking or other activity daily as tolerated Begin checking BG before breakfast and 2 hours after first bite of breakfast, lunch and dinner as directed by MD  Bring Log Book to every medical appointment   Take medication as directed by MD  Patient already has a meter:  True Track And is testing pre breakfast and 2 hours each meal as directed by MD Review of Log Book shows: FBG from 116-170, with most in the 150's    Post meal BG running in the 200's with some 300's.  Patient instructed to monitor glucose levels: FBS: 60 - 95 mg/dl 2 hour: <120 mg/dl  Patient received the following handouts:  Nutrition Diabetes and Pregnancy  Carbohydrate Counting List  Patient will be seen for follow-up in 1 week with MD and 2 weeks with me

## 2016-10-27 ENCOUNTER — Ambulatory Visit (HOSPITAL_COMMUNITY)
Admission: RE | Admit: 2016-10-27 | Discharge: 2016-10-27 | Disposition: A | Discharge status: Home / Self Care | Payer: Self-pay | Source: Ambulatory Visit | Attending: Nurse Practitioner | Admitting: Nurse Practitioner

## 2016-10-27 ENCOUNTER — Encounter (HOSPITAL_COMMUNITY): Payer: Self-pay

## 2016-10-27 ENCOUNTER — Other Ambulatory Visit (HOSPITAL_COMMUNITY): Payer: Self-pay | Admitting: Obstetrics and Gynecology

## 2016-10-27 DIAGNOSIS — O09522 Supervision of elderly multigravida, second trimester: Secondary | ICD-10-CM

## 2016-10-27 DIAGNOSIS — O24410 Gestational diabetes mellitus in pregnancy, diet controlled: Secondary | ICD-10-CM

## 2016-10-27 DIAGNOSIS — O99212 Obesity complicating pregnancy, second trimester: Secondary | ICD-10-CM

## 2016-10-27 DIAGNOSIS — Z3A27 27 weeks gestation of pregnancy: Secondary | ICD-10-CM | POA: Insufficient documentation

## 2016-10-27 DIAGNOSIS — O24415 Gestational diabetes mellitus in pregnancy, controlled by oral hypoglycemic drugs: Secondary | ICD-10-CM | POA: Insufficient documentation

## 2016-10-27 DIAGNOSIS — O321XX0 Maternal care for breech presentation, not applicable or unspecified: Secondary | ICD-10-CM | POA: Insufficient documentation

## 2016-10-29 ENCOUNTER — Ambulatory Visit (INDEPENDENT_AMBULATORY_CARE_PROVIDER_SITE_OTHER): Payer: Self-pay | Admitting: Obstetrics & Gynecology

## 2016-10-29 VITALS — BP 109/56 | HR 70 | Wt 201.0 lb

## 2016-10-29 DIAGNOSIS — O0992 Supervision of high risk pregnancy, unspecified, second trimester: Secondary | ICD-10-CM

## 2016-10-29 DIAGNOSIS — O24415 Gestational diabetes mellitus in pregnancy, controlled by oral hypoglycemic drugs: Secondary | ICD-10-CM

## 2016-10-29 MED ORDER — TETANUS-DIPHTH-ACELL PERTUSSIS 5-2.5-18.5 LF-MCG/0.5 IM SUSP
0.5000 mL | Freq: Once | INTRAMUSCULAR | Status: AC
  Administered 2016-10-29: 0.5 mL via INTRAMUSCULAR

## 2016-10-29 MED ORDER — GLYBURIDE 5 MG PO TABS: 5.0000 mg | ORAL_TABLET | Freq: Two times a day (BID) | ORAL | 1 refills | Status: DC

## 2016-10-29 NOTE — Progress Notes (Signed)
   PRENATAL VISIT NOTE  Subjective:  Joanne Roberts is a 36 y.o. [id number] at 761w5d being seen today for ongoing prenatal care.  She is currently monitored for the following issues for this high-risk pregnancy and has Supervision of high risk pregnancy, antepartum, second trimester; Anemia complicating pregnancy; Advanced maternal age in multigravida; Gestational diabetes mellitus (GDM), antepartum; Obesity (BMI 30-39.9); and Obesity in pregnancy on her problem list.  Patient reports no complaints.  Contractions: Not present. Vag. Bleeding: None.  Movement: Present. Denies leaking of fluid.   The following portions of the patient's history were reviewed and updated as appropriate: allergies, current medications, past family history, past medical history, past social history, past surgical history and problem list. Problem list updated.  Objective:   Vitals:   10/29/16 0833  BP: (!) 109/56  Pulse: 70  Weight: 201 lb (91.2 kg)    Fetal Status: Fetal Heart Rate (bpm): 138   Movement: Present     General:  Alert, oriented and cooperative. Patient is in no acute distress.  Skin: Skin is warm and dry. No rash noted.   Cardiovascular: Normal heart rate noted  Respiratory: Normal respiratory effort, no problems with respiration noted  Abdomen: Soft, gravid, appropriate for gestational age.  Pain/Pressure: Present     Pelvic: Cervical exam deferred        Extremities: Normal range of motion.  Edema: Trace  Mental Status:  Normal mood and affect. Normal behavior. Normal judgment and thought content.   Assessment and Plan:  Pregnancy: [id number] at 7561w5d  1. Gestational diabetes mellitus (GDM) controlled on oral hypoglycemic drug, antepartum FBS 150, pp up to 200, will increase her glyburide dose to 5 mg BID  2. Supervision of high risk pregnancy, antepartum, second trimester F/u US in 3 weeks  Preterm labor symptoms and general obstetric precautions including but not limited to vaginal  bleeding, contractions, leaking of fluid and fetal movement were reviewed in detail with the patient. Please refer to After Visit Summary for other counseling recommendations.  Return in about 1 week (around 11/05/2016).   Scheryl DarterJames Arnold, MD

## 2016-10-29 NOTE — Progress Notes (Signed)
Pt stated having pressure lower abdominal.

## 2016-10-30 LAB — CBC
HEMATOCRIT: 32.2 % — AB (ref 34.0–46.6)
Hemoglobin: 10 g/dL — ABNORMAL LOW (ref 11.1–15.9)
MCH: 25.1 pg — ABNORMAL LOW (ref 26.6–33.0)
MCHC: 31.1 g/dL — AB (ref 31.5–35.7)
MCV: 81 fL (ref 79–97)
PLATELETS: 239 10*3/uL (ref 150–379)
RBC: 3.98 x10E6/uL (ref 3.77–5.28)
RDW: 22.1 % — AB (ref 12.3–15.4)
WBC: 7.3 10*3/uL (ref 3.4–10.8)

## 2016-10-30 LAB — HIV ANTIBODY (ROUTINE TESTING W REFLEX): HIV SCREEN 4TH GENERATION: NONREACTIVE

## 2016-10-30 LAB — RPR: RPR: NONREACTIVE

## 2016-11-03 ENCOUNTER — Other Ambulatory Visit: Payer: Self-pay

## 2016-11-05 ENCOUNTER — Encounter: Payer: Self-pay | Admitting: *Deleted

## 2016-11-05 ENCOUNTER — Ambulatory Visit: Payer: Self-pay | Admitting: *Deleted

## 2016-11-05 ENCOUNTER — Ambulatory Visit (INDEPENDENT_AMBULATORY_CARE_PROVIDER_SITE_OTHER): Payer: Self-pay | Admitting: Obstetrics and Gynecology

## 2016-11-05 VITALS — BP 108/55 | HR 66 | Wt 201.5 lb

## 2016-11-05 DIAGNOSIS — O09522 Supervision of elderly multigravida, second trimester: Secondary | ICD-10-CM

## 2016-11-05 DIAGNOSIS — O24419 Gestational diabetes mellitus in pregnancy, unspecified control: Secondary | ICD-10-CM

## 2016-11-05 DIAGNOSIS — O0992 Supervision of high risk pregnancy, unspecified, second trimester: Secondary | ICD-10-CM

## 2016-11-05 DIAGNOSIS — E669 Obesity, unspecified: Secondary | ICD-10-CM

## 2016-11-05 DIAGNOSIS — O24414 Gestational diabetes mellitus in pregnancy, insulin controlled: Secondary | ICD-10-CM

## 2016-11-05 DIAGNOSIS — O99012 Anemia complicating pregnancy, second trimester: Secondary | ICD-10-CM

## 2016-11-05 MED ORDER — INSULIN SYRINGE 31G X 5/16" 1 ML MISC
1.0000 | Freq: Four times a day (QID) | 7 refills | Status: DC
Start: 2016-11-05 — End: 2016-11-11

## 2016-11-05 MED ORDER — INSULIN REGULAR HUMAN 100 UNIT/ML IJ SOLN: INTRAMUSCULAR | 11 refills | Status: DC

## 2016-11-05 MED ORDER — "INSULIN SYRINGE-NEEDLE U-100 31G X 15/64"" 0.5 ML MISC": 1.0000 | Freq: Four times a day (QID) | 7 refills | Status: DC

## 2016-11-05 MED ORDER — INSULIN NPH (HUMAN) (ISOPHANE) 100 UNIT/ML ~~LOC~~ SUSP: SUBCUTANEOUS | 11 refills | Status: DC

## 2016-11-05 NOTE — Progress Notes (Signed)
   PRENATAL VISIT NOTE  Subjective:  Joanne Roberts is a 36 y.o. [id number] at 7844w5d being seen today for ongoing prenatal care.  She is currently monitored for the following issues for this high-risk pregnancy and has Supervision of high risk pregnancy, antepartum, second trimester; Anemia complicating pregnancy; Advanced maternal age in multigravida; Gestational diabetes mellitus (GDM), antepartum; Obesity (BMI 30-39.9); and Obesity in pregnancy on her problem list.  Patient reports no complaints today, had hypoglycemic episdoe at grocery store yesterday where she felt light headed, had some candy and a soda and then felt better, checked sugar in car and it was 75.  Contractions: Not present. Vag. Bleeding: None.  Movement: Present. Denies leaking of fluid. Occasional "white flecks" in urine but thinks it is mucous. Also c/o some mild urinary incontinence.  Glyburide 5 mg BID One hypoglyemic episode FG: one 95 and all others > 120 BG: mostly > 140s, occasional > 200  The following portions of the patient's history were reviewed and updated as appropriate: allergies, current medications, past family history, past medical history, past social history, past surgical history and problem list. Problem list updated.  Objective:   Vitals:   11/05/16 0923  BP: (!) 108/55  Pulse: 66  Weight: 201 lb 8 oz (91.4 kg)    Fetal Status: Fetal Heart Rate (bpm): 130   Movement: Present     General:  Alert, oriented and cooperative. Patient is in no acute distress.  Skin: Skin is warm and dry. No rash noted.   Cardiovascular: Normal heart rate noted  Respiratory: Normal respiratory effort, no problems with respiration noted  Abdomen: Soft, gravid, appropriate for gestational age.  Pain/Pressure: Present     Pelvic: Cervical exam deferred        Extremities: Normal range of motion.  Edema: Trace  Mental Status:  Normal mood and affect. Normal behavior. Normal judgment and thought content.    Assessment and Plan:  Pregnancy: [id number] at 2644w5d  1. Supervision of high risk pregnancy, antepartum, second trimester Flu declined  2. Gestational diabetes mellitus (GDM), antepartum, gestational diabetes method of control unspecified Uncontrolled on glyburide with hypoglycemic episode today, will start insulin Regular 16 units before breakfast, 13 before dinner NPH 40 units before breakfast, 13 units at bedtime To meet with diabetic educator today  3. Obesity (BMI 30-39.9)  4. Urinary incontinence - reviewed kegel exercises  Preterm labor symptoms and general obstetric precautions including but not limited to vaginal bleeding, contractions, leaking of fluid and fetal movement were reviewed in detail with the patient. Please refer to After Visit Summary for other counseling recommendations.  Return in about 2 weeks (around 11/19/2016) for OB visit (MD).   Conan BowensKelly M Climmie Buelow, MD

## 2016-11-05 NOTE — Progress Notes (Signed)
C/o yesterday had episode of blurry vision and dizziness.  Had taken glyburide and eaten before that; states was at store so she drank some soda and felt better and checked cbg in the car and by then it was 75. Will see nutritionist today.

## 2016-11-05 NOTE — Progress Notes (Signed)
  Patient was seen on 11/05/2016 for Gestational Diabetes self-management . Patient states history of GDM with last pregnancy 5 years ago. She obtained a True Track meter here 2 weeks ago and brought BG data sheet today. All BG numbers continue to be above target ranges even with increase in dose of Glyburide to 5 mg twice daily last week. Orders noted to start insulin as follows.   TDD (total daily dose) per day based on 0.9 units per kg (92 kg)  = 82 units Pre breakfast insulin: 2/3 daily dose = 56 units  1/3 of that = Regular @ 16 units mixed with   2/3 of that = NPH @ 40 units  Evening insulin @ 1/3 TDD = 26 units  1/2 of that = 13 units Regular at supper  1/2 of that = 13 units NPH at bedtime  Insulin Instruction  Patient was seen on 11/05/2016 for insulin instruction.  The following learning objectives were met by the patient during this visit:   Insulin Action of NPH and Regular insulins  Reviewed syringe & vial including # units per syringe,    length of needles  Hygiene and storage  Drawing up single and mixed doses if using vials   Single dose   Mixed dose:   Rotation of Sites  Hypoglycemia- symptoms, causes , treatment choices  Record keeping and MD follow up   Patient demonstrated understanding of insulin administration by return demonstration.  Patient received the following handouts:  Insulin Instruction Handout  Insulin Action handout                                        Patient to start on insulin as Rx'd by MD  Patient will be seen for follow-up as needed.  Patient will be seen for follow-up in 1 week with MD and 2 weeks with me

## 2016-11-05 NOTE — Patient Instructions (Addendum)
Third Trimester of Pregnancy The third trimester is from week 28 through week 40 (months 7 through 9). The third trimester is a time when the unborn baby (fetus) is growing rapidly. At the end of the ninth month, the fetus is about 20 inches in length and weighs 6-10 pounds. Body changes during your third trimester Your body will continue to go through many changes during pregnancy. The changes vary from woman to woman. During the third trimester:  Your weight will continue to increase. You can expect to gain 25-35 pounds (11-16 kg) by the end of the pregnancy.  You may begin to get stretch marks on your hips, abdomen, and breasts.  You may urinate more often because the fetus is moving lower into your pelvis and pressing on your bladder.  You may develop or continue to have heartburn. This is caused by increased hormones that slow down muscles in the digestive tract.  You may develop or continue to have constipation because increased hormones slow digestion and cause the muscles that push waste through your intestines to relax.  You may develop hemorrhoids. These are swollen veins (varicose veins) in the rectum that can itch or be painful.  You may develop swollen, bulging veins (varicose veins) in your legs.  You may have increased body aches in the pelvis, back, or thighs. This is due to weight gain and increased hormones that are relaxing your joints.  You may have changes in your hair. These can include thickening of your hair, rapid growth, and changes in texture. Some women also have hair loss during or after pregnancy, or hair that feels dry or thin. Your hair will most likely return to normal after your baby is born.  Your breasts will continue to grow and they will continue to become tender. A yellow fluid (colostrum) may leak from your breasts. This is the first milk you are producing for your baby.  Your belly button may stick out.  You may notice more swelling in your hands,  face, or ankles.  You may have increased tingling or numbness in your hands, arms, and legs. The skin on your belly may also feel numb.  You may feel short of breath because of your expanding uterus.  You may have more problems sleeping. This can be caused by the size of your belly, increased need to urinate, and an increase in your body's metabolism.  You may notice the fetus "dropping," or moving lower in your abdomen (lightening).  You may have increased vaginal discharge.  You may notice your joints feel loose and you may have pain around your pelvic bone.  What to expect at prenatal visits You will have prenatal exams every 2 weeks until week 36. Then you will have weekly prenatal exams. During a routine prenatal visit:  You will be weighed to make sure you and the baby are growing normally.  Your blood pressure will be taken.  Your abdomen will be measured to track your baby's growth.  The fetal heartbeat will be listened to.  Any test results from the previous visit will be discussed.  You may have a cervical check near your due date to see if your cervix has softened or thinned (effaced).  You will be tested for Group B streptococcus. This happens between 35 and 37 weeks.  Your health care provider may ask you:  What your birth plan is.  How you are feeling.  If you are feeling the baby move.  If you have had   any abnormal symptoms, such as leaking fluid, bleeding, severe headaches, or abdominal cramping.  If you are using any tobacco products, including cigarettes, chewing tobacco, and electronic cigarettes.  If you have any questions.  Other tests or screenings that may be performed during your third trimester include:  Blood tests that check for low iron levels (anemia).  Fetal testing to check the health, activity level, and growth of the fetus. Testing is done if you have certain medical conditions or if there are problems during the  pregnancy.  Nonstress test (NST). This test checks the health of your baby to make sure there are no signs of problems, such as the baby not getting enough oxygen. During this test, a belt is placed around your belly. The baby is made to move, and its heart rate is monitored during movement.  What is false labor? False labor is a condition in which you feel small, irregular tightenings of the muscles in the womb (contractions) that usually go away with rest, changing position, or drinking water. These are called Braxton Hicks contractions. Contractions may last for hours, days, or even weeks before true labor sets in. If contractions come at regular intervals, become more frequent, increase in intensity, or become painful, you should see your health care provider. What are the signs of labor?  Abdominal cramps.  Regular contractions that start at 10 minutes apart and become stronger and more frequent with time.  Contractions that start on the top of the uterus and spread down to the lower abdomen and back.  Increased pelvic pressure and dull back pain.  A watery or bloody mucus discharge that comes from the vagina.  Leaking of amniotic fluid. This is also known as your "water breaking." It could be a slow trickle or a gush. Let your health care provider know if it has a color or strange odor. If you have any of these signs, call your health care provider right away, even if it is before your due date. Follow these instructions at home: Medicines  Follow your health care provider's instructions regarding medicine use. Specific medicines may be either safe or unsafe to take during pregnancy.  Take a prenatal vitamin that contains at least 600 micrograms (mcg) of folic acid.  If you develop constipation, try taking a stool softener if your health care provider approves. Eating and drinking  Eat a balanced diet that includes fresh fruits and vegetables, whole grains, good sources of protein  such as meat, eggs, or tofu, and low-fat dairy. Your health care provider will help you determine the amount of weight gain that is right for you.  Avoid raw meat and uncooked cheese. These carry germs that can cause birth defects in the baby.  If you have low calcium intake from food, talk to your health care provider about whether you should take a daily calcium supplement.  Eat four or five small meals rather than three large meals a day.  Limit foods that are high in fat and processed sugars, such as fried and sweet foods.  To prevent constipation: ? Drink enough fluid to keep your urine clear or pale yellow. ? Eat foods that are high in fiber, such as fresh fruits and vegetables, whole grains, and beans. Activity  Exercise only as directed by your health care provider. Most women can continue their usual exercise routine during pregnancy. Try to exercise for 30 minutes at least 5 days a week. Stop exercising if you experience uterine contractions.  Avoid heavy   lifting.  Do not exercise in extreme heat or humidity, or at high altitudes.  Wear low-heel, comfortable shoes.  Practice good posture.  You may continue to have sex unless your health care provider tells you otherwise. Relieving pain and discomfort  Take frequent breaks and rest with your legs elevated if you have leg cramps or low back pain.  Take warm sitz baths to soothe any pain or discomfort caused by hemorrhoids. Use hemorrhoid cream if your health care provider approves.  Wear a good support bra to prevent discomfort from breast tenderness.  If you develop varicose veins: ? Wear support pantyhose or compression stockings as told by your healthcare provider. ? Elevate your feet for 15 minutes, 3-4 times a day. Prenatal care  Write down your questions. Take them to your prenatal visits.  Keep all your prenatal visits as told by your health care provider. This is important. Safety  Wear your seat belt at  all times when driving.  Make a list of emergency phone numbers, including numbers for family, friends, the hospital, and police and fire departments. General instructions  Avoid cat litter boxes and soil used by cats. These carry germs that can cause birth defects in the baby. If you have a cat, ask someone to clean the litter box for you.  Do not travel far distances unless it is absolutely necessary and only with the approval of your health care provider.  Do not use hot tubs, steam rooms, or saunas.  Do not drink alcohol.  Do not use any products that contain nicotine or tobacco, such as cigarettes and e-cigarettes. If you need help quitting, ask your health care provider.  Do not use any medicinal herbs or unprescribed drugs. These chemicals affect the formation and growth of the baby.  Do not douche or use tampons or scented sanitary pads.  Do not cross your legs for long periods of time.  To prepare for the arrival of your baby: ? Take prenatal classes to understand, practice, and ask questions about labor and delivery. ? Make a trial run to the hospital. ? Visit the hospital and tour the maternity area. ? Arrange for maternity or paternity leave through employers. ? Arrange for family and friends to take care of pets while you are in the hospital. ? Purchase a rear-facing car seat and make sure you know how to install it in your car. ? Pack your hospital bag. ? Prepare the baby's nursery. Make sure to remove all pillows and stuffed animals from the baby's crib to prevent suffocation.  Visit your dentist if you have not gone during your pregnancy. Use a soft toothbrush to brush your teeth and be gentle when you floss. Contact a health care provider if:  You are unsure if you are in labor or if your water has broken.  You become dizzy.  You have mild pelvic cramps, pelvic pressure, or nagging pain in your abdominal area.  You have lower back pain.  You have persistent  nausea, vomiting, or diarrhea.  You have an unusual or bad smelling vaginal discharge.  You have pain when you urinate. Get help right away if:  Your water breaks before 37 weeks.  You have regular contractions less than 5 minutes apart before 37 weeks.  You have a fever.  You are leaking fluid from your vagina.  You have spotting or bleeding from your vagina.  You have severe abdominal pain or cramping.  You have rapid weight loss or weight gain.    You have shortness of breath with chest pain.  You notice sudden or extreme swelling of your face, hands, ankles, feet, or legs.  Your baby makes fewer than 10 movements in 2 hours.  You have severe headaches that do not go away when you take medicine.  You have vision changes. Summary  The third trimester is from week 28 through week 40, months 7 through 9. The third trimester is a time when the unborn baby (fetus) is growing rapidly.  During the third trimester, your discomfort may increase as you and your baby continue to gain weight. You may have abdominal, leg, and back pain, sleeping problems, and an increased need to urinate.  During the third trimester your breasts will keep growing and they will continue to become tender. A yellow fluid (colostrum) may leak from your breasts. This is the first milk you are producing for your baby.  False labor is a condition in which you feel small, irregular tightenings of the muscles in the womb (contractions) that eventually go away. These are called Braxton Hicks contractions. Contractions may last for hours, days, or even weeks before true labor sets in.  Signs of labor can include: abdominal cramps; regular contractions that start at 10 minutes apart and become stronger and more frequent with time; watery or bloody mucus discharge that comes from the vagina; increased pelvic pressure and dull back pain; and leaking of amniotic fluid. This information is not intended to replace advice  given to you by your health care provider. Make sure you discuss any questions you have with your health care provider. Document Released: 12/23/2000 Document Revised: 06/06/2015 Document Reviewed: 03/01/2012 Elsevier Interactive Patient Education  2017 Else[employer] Inc.     Kegel Exercises Kegel exercises help strengthen the muscles that support the rectum, vagina, small intestine, bladder, and uterus. Doing Kegel exercises can help:  Improve bladder and bowel control.  Improve sexual response.  Reduce problems and discomfort during pregnancy.  Kegel exercises involve squeezing your pelvic floor muscles, which are the same muscles you squeeze when you try to stop the flow of urine. The exercises can be done while sitting, standing, or lying down, but it is best to vary your position. Phase 1 exercises 1. Squeeze your pelvic floor muscles tight. You should feel a tight lift in your rectal area. If you are a female, you should also feel a tightness in your vaginal area. Keep your stomach, buttocks, and legs relaxed. 2. Hold the muscles tight for up to 10 seconds. 3. Relax your muscles. Repeat this exercise 50 times a day or as many times as told by your health care provider. Continue to do this exercise for at least 4-6 weeks or for as long as told by your health care provider. This information is not intended to replace advice given to you by your health care provider. Make sure you discuss any questions you have with your health care provider. Document Released: 12/16/2011 Document Revised: 08/24/2015 Document Reviewed: 11/18/2014 Elsevier Interactive Patient Education  [employer]2018 [employer] Inc.

## 2016-11-11 ENCOUNTER — Encounter: Payer: Self-pay | Admitting: Advanced Practice Midwife

## 2016-11-11 ENCOUNTER — Ambulatory Visit (INDEPENDENT_AMBULATORY_CARE_PROVIDER_SITE_OTHER): Payer: Self-pay | Admitting: Advanced Practice Midwife

## 2016-11-11 DIAGNOSIS — O24414 Gestational diabetes mellitus in pregnancy, insulin controlled: Secondary | ICD-10-CM

## 2016-11-11 LAB — POCT URINALYSIS DIP (DEVICE)
Glucose, UA: NEGATIVE mg/dL
HGB URINE DIPSTICK: NEGATIVE
Ketones, ur: NEGATIVE mg/dL
Nitrite: NEGATIVE
Protein, ur: NEGATIVE mg/dL
SPECIFIC GRAVITY, URINE: 1.025 (ref 1.005–1.030)
Urobilinogen, UA: 2 mg/dL — ABNORMAL HIGH (ref 0.0–1.0)
pH: 6.5 (ref 5.0–8.0)

## 2016-11-11 MED ORDER — "INSULIN SYRINGE 31G X 5/16"" 1 ML MISC": 1.0000 | Freq: Four times a day (QID) | 7 refills | Status: DC

## 2016-11-11 NOTE — Patient Instructions (Addendum)
Regular 16 units before breakfast, 16 before dinner NPH 42 units before breakfast, 16 units at bedtime   Pregnancy and Influenza Influenza, also called the flu, is an infection of the respiratory tract. If you are pregnant, you are more likely to catch the flu. You are also more likely to have a more serious case of the flu. This is because pregnancy lowers your body's ability to fight off infections (it weakens your immune system). It also puts additional stress on your heart and lungs, which makes you more likely to have complications. Having a bad case of the flu, especially with a high fever, can be dangerous for your developing baby. It can cause you to go into early labor. How do people get the flu? The flu is caused by the influenza virus. This virus is common every year in the fall and winter. It spreads when virus particles get passed from person to person. You can get the virus if you are near a sick person who is coughing or sneezing. You can also get the virus if you touch something that has the virus on it and then touch your face. How can I protect myself against the flu?  Get a flu shot. The best way to prevent the flu is to get a flu shot before flu season starts. The flu shot is not dangerous for your developing baby. It may even help protect your baby from the flu for up to 6 months after birth. The flu shot is one type of flu vaccine. Another type is a nasal spray vaccine. Do not get the nasal spray vaccine. It is not approved for pregnancy.  Do not come in close contact with sick people.  Do not share food, drinks, or utensils with other people.  Wash your hands often. Use hand sanitizer when soap and water are not available. What should I do if I have flu symptoms? If you have any flu symptoms, call your health care provider right away. Flu symptoms include:  Fever or chills.  Muscle aches.  Headache.  Sore throat.  Nasal congestion.  Cough.  Feeling  tired.  Loss of appetite.  Vomiting.  Diarrhea.  You may be able to take an antiviral medicine to keep the flu from becoming severe and to shorten how long it lasts. What should I do at home if I am diagnosed with the flu?  Do not take any medicine, including cold or flu medicine, unless directed by your health care provider.  If you take antiviral medicine, make sure you finish it even if you start to feel better.  Drink enough fluid to keep your urine clear or pale yellow.  Get plenty of rest. When would I seek immediate medical care if I have the flu?  You have trouble breathing.  You have chest pain.  You begin to have labor pains.  You have a high fever that does not go down after you take medicine.  You do not feel your baby move.  You have diarrhea or vomiting that will not go away. This information is not intended to replace advice given to you by your health care provider. Make sure you discuss any questions you have with your health care provider. Document Released: 11/01/2007 Document Revised: 06/06/2015 Document Reviewed: 11/25/2012 Elsevier Interactive Patient Education  2017 [employer]

## 2016-11-11 NOTE — Progress Notes (Signed)
Pt stated having painful bottom/top of the abdominal

## 2016-11-11 NOTE — Progress Notes (Signed)
   PRENATAL VISIT NOTE  Subjective:  Joanne Roberts is a 36 y.o. [id number] at 3536w4d being seen today for ongoing prenatal care.  She is currently monitored for the following issues for this high-risk pregnancy and has Supervision of high risk pregnancy, antepartum, second trimester; Anemia complicating pregnancy; Advanced maternal age in multigravida; Gestational diabetes mellitus (GDM), antepartum; Obesity (BMI 30-39.9); and Obesity in pregnancy on her problem list.  Patient reports no complaints.  Contractions: Not present. Vag. Bleeding: None.  Movement: Present. Denies leaking of fluid.   The following portions of the patient's history were reviewed and updated as appropriate: allergies, current medications, past family history, past medical history, past social history, past surgical history and problem list. Problem list updated.  Objective:   Vitals:   11/11/16 0911  BP: (!) 108/57  Pulse: 72  Weight: 205 lb (93 kg)    Fetal Status: Fetal Heart Rate (bpm): 138 Fundal Height: 33 cm Movement: Present     General:  Alert, oriented and cooperative. Patient is in no acute distress.  Skin: Skin is warm and dry. No rash noted.   Cardiovascular: Normal heart rate noted  Respiratory: Normal respiratory effort, no problems with respiration noted  Abdomen: Soft, gravid, appropriate for gestational age.  Pain/Pressure: Present     Pelvic: Cervical exam deferred        Extremities: Normal range of motion.  Edema: Trace  Mental Status:  Normal mood and affect. Normal behavior. Normal judgment and thought content.   Fasting CBGs: 96-126 (100% out of range) PCB:  98-145 (75% out of range) PCL:  109-157 (75% out of range) PCD: 149-172 (100% out of range)   Assessment and Plan:  Pregnancy: [id number] at [redacted]w[redacted]d  1. Insulin controlled gestational diabetes mellitus (GDM) during pregnancy, antepartum - Increase Insulin to Regular 16 units before breakfast, 16 before dinner NPH 42 units  before breakfast, 16 units at bedtime - Insulin Syringe-Needle U-100 (INSULIN SYRINGE 1CC/31GX5/16") 31G X 5/16" 1 ML MISC; 1 Device by Does not apply route 4 (four) times daily.  Dispense: 120 each; Refill: 7  Preterm labor symptoms and general obstetric precautions including but not limited to vaginal bleeding, contractions, leaking of fluid and fetal movement were reviewed in detail with the patient. Please refer to After Visit Summary for other counseling recommendations.  F/U 1 we   Dorathy KinsmanVirginia Santina Trillo, CNM

## 2016-11-19 ENCOUNTER — Ambulatory Visit (INDEPENDENT_AMBULATORY_CARE_PROVIDER_SITE_OTHER): Payer: Self-pay | Admitting: Obstetrics and Gynecology

## 2016-11-19 ENCOUNTER — Encounter: Payer: Self-pay | Admitting: Obstetrics and Gynecology

## 2016-11-19 ENCOUNTER — Encounter: Payer: Self-pay | Attending: Obstetrics & Gynecology | Admitting: *Deleted

## 2016-11-19 ENCOUNTER — Ambulatory Visit: Payer: Self-pay | Admitting: *Deleted

## 2016-11-19 VITALS — BP 104/53 | HR 70 | Wt 201.5 lb

## 2016-11-19 DIAGNOSIS — Z23 Encounter for immunization: Secondary | ICD-10-CM

## 2016-11-19 DIAGNOSIS — O368130 Decreased fetal movements, third trimester, not applicable or unspecified: Secondary | ICD-10-CM | POA: Insufficient documentation

## 2016-11-19 DIAGNOSIS — R7309 Other abnormal glucose: Secondary | ICD-10-CM

## 2016-11-19 DIAGNOSIS — O0993 Supervision of high risk pregnancy, unspecified, third trimester: Secondary | ICD-10-CM

## 2016-11-19 DIAGNOSIS — O09523 Supervision of elderly multigravida, third trimester: Secondary | ICD-10-CM

## 2016-11-19 DIAGNOSIS — Z3A00 Weeks of gestation of pregnancy not specified: Secondary | ICD-10-CM | POA: Insufficient documentation

## 2016-11-19 DIAGNOSIS — O24414 Gestational diabetes mellitus in pregnancy, insulin controlled: Secondary | ICD-10-CM | POA: Insufficient documentation

## 2016-11-19 DIAGNOSIS — Z713 Dietary counseling and surveillance: Secondary | ICD-10-CM | POA: Insufficient documentation

## 2016-11-19 DIAGNOSIS — O0992 Supervision of high risk pregnancy, unspecified, second trimester: Secondary | ICD-10-CM

## 2016-11-19 DIAGNOSIS — O99013 Anemia complicating pregnancy, third trimester: Secondary | ICD-10-CM

## 2016-11-19 NOTE — Progress Notes (Signed)
States felt like baby didn't move as much- mostly not moving much- usually moves a lot all day. Today has felt some movement ,not as much as usual. C/o " a lot of pressure" for 2 days.

## 2016-11-19 NOTE — Progress Notes (Signed)
Subjective:  Joanne Roberts is a 36 y.o. [id number] at 4037w5d being seen today for ongoing prenatal care.  She is currently monitored for the following issues for this high-risk pregnancy and has Supervision of high risk pregnancy, antepartum, second trimester; Anemia complicating pregnancy; Advanced maternal age in multigravida; Gestational diabetes mellitus (GDM), antepartum; Obesity (BMI 30-39.9); Obesity in pregnancy; and Decreased fetal movements in third trimester on their problem list.  Patient reports decreased fetal movement.  Contractions: Irregular. Vag. Bleeding: None.  Movement: Present. Denies leaking of fluid.   The following portions of the patient's history were reviewed and updated as appropriate: allergies, current medications, past family history, past medical history, past social history, past surgical history and problem list. Problem list updated.  Objective:   Vitals:   11/19/16 0815  BP: (!) 104/53  Pulse: 70  Weight: 91.4 kg (201 lb 8 oz)    Fetal Status:     Movement: Present     General:  Alert, oriented and cooperative. Patient is in no acute distress.  Skin: Skin is warm and dry. No rash noted.   Cardiovascular: Normal heart rate noted  Respiratory: Normal respiratory effort, no problems with respiration noted  Abdomen: Soft, gravid, appropriate for gestational age. Pain/Pressure: Present     Pelvic:  Cervical exam deferred        Extremities: Normal range of motion.  Edema: Trace  Mental Status: Normal mood and affect. Normal behavior. Normal judgment and thought content.   Urinalysis:      Assessment and Plan:  Pregnancy: [id number] at 4537w5d  1. Supervision of high risk pregnancy, antepartum, second trimester Stable Flu vaccine today  2. Anemia affecting pregnancy in third trimester Continue with PNV  3. Elderly multigravida in third trimester Nl NIPS  4. Decreased fetal movements in third trimester, single or unspecified fetus Reactive NST  -  Fetal nonstress test  5. DM BS improved with last insulin adjustment but not quite all in goal range Will increase insulin as follows:  NPH breakfast 45 units Reg 18 units  Dinner  Reg 16 units ( pt was taking 13 units )  Bedtime  NPH 18 units  Growth scan next week and start twice weekly antenatal testing with next OB visit  Preterm labor symptoms and general obstetric precautions including but not limited to vaginal bleeding, contractions, leaking of fluid and fetal movement were reviewed in detail with the patient. Please refer to After Visit Summary for other counseling recommendations.  Return in about 2 weeks (around 12/03/2016) for NST/BPP and HOB, OB visit.   Hermina StaggersErvin, Lorrayne Ismael L, MD

## 2016-11-19 NOTE — Progress Notes (Signed)
  Patient was seen on 11/19/2016 for Gestational Diabetes self-management follow up visit. EDD 01/23/2017. She has been taking insulin successfully and brought her BG Log Book with her today. Her MD has made insulin adjustments already today, see below.  Patient states she is eating well, feels comfortable with Carb Counting and also with insulin administration. She states she is using her upper arms and her outer thighs. No complaints of hypoglycemia.   Current insulin doses as of this AM MD appointment: Breakfast doses:   Regular @ 18 units mixed with   NPH @ 45 units  Evening insulin   16 units Regular at supper  18 units NPH at bedtime  Patient doing great job recording her BG's as requested. She states she rarely has an evening snack and when she does it is typically a small piece of fresh fruit (tangerine). I suggested a larger snack that includes some protein to help with fasting BG. She plans to try milk or yogurt in addition to her fresh fruit.   Patient will be seen for follow-up as needed.

## 2016-11-24 ENCOUNTER — Other Ambulatory Visit (HOSPITAL_COMMUNITY): Payer: Self-pay | Admitting: Obstetrics and Gynecology

## 2016-11-24 ENCOUNTER — Ambulatory Visit (HOSPITAL_COMMUNITY)
Admission: RE | Admit: 2016-11-24 | Discharge: 2016-11-24 | Disposition: A | Discharge status: Home / Self Care | Payer: Self-pay | Source: Ambulatory Visit | Attending: Nurse Practitioner | Admitting: Nurse Practitioner

## 2016-11-24 ENCOUNTER — Other Ambulatory Visit (HOSPITAL_COMMUNITY): Payer: Self-pay | Admitting: *Deleted

## 2016-11-24 ENCOUNTER — Encounter (HOSPITAL_COMMUNITY): Payer: Self-pay

## 2016-11-24 DIAGNOSIS — O321XX0 Maternal care for breech presentation, not applicable or unspecified: Secondary | ICD-10-CM | POA: Insufficient documentation

## 2016-11-24 DIAGNOSIS — Z3A31 31 weeks gestation of pregnancy: Secondary | ICD-10-CM | POA: Insufficient documentation

## 2016-11-24 DIAGNOSIS — O24414 Gestational diabetes mellitus in pregnancy, insulin controlled: Secondary | ICD-10-CM

## 2016-11-24 DIAGNOSIS — O99213 Obesity complicating pregnancy, third trimester: Secondary | ICD-10-CM | POA: Insufficient documentation

## 2016-11-24 DIAGNOSIS — O24410 Gestational diabetes mellitus in pregnancy, diet controlled: Secondary | ICD-10-CM

## 2016-11-24 DIAGNOSIS — O09523 Supervision of elderly multigravida, third trimester: Secondary | ICD-10-CM | POA: Insufficient documentation

## 2016-12-07 ENCOUNTER — Ambulatory Visit: Payer: Self-pay | Admitting: *Deleted

## 2016-12-07 ENCOUNTER — Ambulatory Visit (INDEPENDENT_AMBULATORY_CARE_PROVIDER_SITE_OTHER): Payer: Self-pay | Admitting: Obstetrics and Gynecology

## 2016-12-07 ENCOUNTER — Ambulatory Visit: Payer: Self-pay

## 2016-12-07 ENCOUNTER — Encounter: Payer: Self-pay | Admitting: Obstetrics and Gynecology

## 2016-12-07 VITALS — BP 114/53 | HR 75 | Wt 201.5 lb

## 2016-12-07 DIAGNOSIS — O24414 Gestational diabetes mellitus in pregnancy, insulin controlled: Secondary | ICD-10-CM

## 2016-12-07 DIAGNOSIS — O09523 Supervision of elderly multigravida, third trimester: Secondary | ICD-10-CM

## 2016-12-07 DIAGNOSIS — O0992 Supervision of high risk pregnancy, unspecified, second trimester: Secondary | ICD-10-CM

## 2016-12-07 DIAGNOSIS — O99013 Anemia complicating pregnancy, third trimester: Secondary | ICD-10-CM

## 2016-12-07 LAB — POCT URINALYSIS DIP (DEVICE)
BILIRUBIN URINE: NEGATIVE
Glucose, UA: NEGATIVE mg/dL
HGB URINE DIPSTICK: NEGATIVE
KETONES UR: NEGATIVE mg/dL
NITRITE: NEGATIVE
PH: 6.5 (ref 5.0–8.0)
Protein, ur: NEGATIVE mg/dL
SPECIFIC GRAVITY, URINE: 1.025 (ref 1.005–1.030)
Urobilinogen, UA: 1 mg/dL (ref 0.0–1.0)

## 2016-12-07 NOTE — Progress Notes (Signed)

## 2016-12-07 NOTE — Progress Notes (Signed)
US for growth scheduled 12/11, BPP added

## 2016-12-07 NOTE — Progress Notes (Signed)
   PRENATAL VISIT NOTE  Subjective:  Joanne Roberts is a 36 y.o. [id number] at 6121w2d being seen today for ongoing prenatal care.  She is currently monitored for the following issues for this high-risk pregnancy and has Supervision of high risk pregnancy, antepartum, second trimester; Anemia complicating pregnancy; Advanced maternal age in multigravida; Gestational diabetes mellitus (GDM), antepartum; Obesity (BMI 30-39.9); Obesity in pregnancy; and Decreased fetal movements in third trimester on their problem list.  Patient reports occasional contractions and and some pressure.  Contractions: Irregular. Vag. Bleeding: None, Scant.  Movement: Present. Denies leaking of fluid.   DM NPH breakfast 45 units Reg 18 units Dinner  Reg 16 units Bedtime  NPH 18 units FG: about half > 95 PP: mostly under 120, a few > 130  The following portions of the patient's history were reviewed and updated as appropriate: allergies, current medications, past family history, past medical history, past social history, past surgical history and problem list. Problem list updated.  Objective:   Vitals:   12/07/16 0754  BP: (!) 114/53  Pulse: 75  Weight: 201 lb 8 oz (91.4 kg)    Fetal Status: Fetal Heart Rate (bpm): NST   Movement: Present     General:  Alert, oriented and cooperative. Patient is in no acute distress.  Skin: Skin is warm and dry. No rash noted.   Cardiovascular: Normal heart rate noted  Respiratory: Normal respiratory effort, no problems with respiration noted  Abdomen: Soft, gravid, appropriate for gestational age.  Pain/Pressure: Present     Pelvic: Cervical exam deferred        Extremities: Normal range of motion.     Mental Status:  Normal mood and affect. Normal behavior. Normal judgment and thought content.   Assessment and Plan:  Pregnancy: [id number] at 6321w2d  1. Supervision of high risk pregnancy, antepartum, second trimester - US MFM FETAL BPP WO NON STRESS; Future BTL papers  signed today - she will call financial to discuss ? Medicaid status and fees as she is self pay   2. Insulin controlled gestational diabetes mellitus (GDM) during pregnancy, antepartum - US MFM FETAL BPP WO NON STRESS; Future BG fairly well controlled except for fasting NPH breakfast 45 units Reg 18 units Dinner  Reg 16 units Bedtime  NPH 18 units -> will increase to 20 units   BPP 10/10 today Cont weekly testing  3. Anemia affecting pregnancy in third trimester  4. Elderly multigravida in third trimester   Preterm labor symptoms and general obstetric precautions including but not limited to vaginal bleeding, contractions, leaking of fluid and fetal movement were reviewed in detail with the patient. Please refer to After Visit Summary for other counseling recommendations.  Return in about 7 days (around 12/14/2016) for NST/BPP & HOB; 12/11 NST & HOB, has US @ 0930, OB visit (MD).   Conan BowensKelly M Makoa Satz, MD

## 2016-12-14 ENCOUNTER — Ambulatory Visit: Payer: Self-pay

## 2016-12-14 ENCOUNTER — Encounter: Payer: Self-pay | Admitting: Obstetrics and Gynecology

## 2016-12-14 ENCOUNTER — Ambulatory Visit (INDEPENDENT_AMBULATORY_CARE_PROVIDER_SITE_OTHER): Payer: Self-pay | Admitting: Obstetrics and Gynecology

## 2016-12-14 ENCOUNTER — Ambulatory Visit (INDEPENDENT_AMBULATORY_CARE_PROVIDER_SITE_OTHER): Payer: Self-pay | Admitting: *Deleted

## 2016-12-14 VITALS — BP 106/55 | HR 77 | Wt 202.8 lb

## 2016-12-14 DIAGNOSIS — O99013 Anemia complicating pregnancy, third trimester: Secondary | ICD-10-CM

## 2016-12-14 DIAGNOSIS — O09523 Supervision of elderly multigravida, third trimester: Secondary | ICD-10-CM

## 2016-12-14 DIAGNOSIS — O24414 Gestational diabetes mellitus in pregnancy, insulin controlled: Secondary | ICD-10-CM

## 2016-12-14 DIAGNOSIS — O0992 Supervision of high risk pregnancy, unspecified, second trimester: Secondary | ICD-10-CM

## 2016-12-14 DIAGNOSIS — O0993 Supervision of high risk pregnancy, unspecified, third trimester: Secondary | ICD-10-CM

## 2016-12-14 DIAGNOSIS — Z113 Encounter for screening for infections with a predominantly sexual mode of transmission: Secondary | ICD-10-CM

## 2016-12-14 DIAGNOSIS — N898 Other specified noninflammatory disorders of vagina: Secondary | ICD-10-CM

## 2016-12-14 LAB — POCT URINALYSIS DIP (DEVICE)
Bilirubin Urine: NEGATIVE
Glucose, UA: NEGATIVE mg/dL
Hgb urine dipstick: NEGATIVE
KETONES UR: NEGATIVE mg/dL
Nitrite: NEGATIVE
PH: 7 (ref 5.0–8.0)
PROTEIN: NEGATIVE mg/dL
Specific Gravity, Urine: 1.02 (ref 1.005–1.030)
Urobilinogen, UA: 1 mg/dL (ref 0.0–1.0)

## 2016-12-14 NOTE — Progress Notes (Signed)
Pt had episode of dizziness and seeing lights last pm.  US for growth scheduled 12/11, BPP added.

## 2016-12-14 NOTE — Progress Notes (Signed)
   PRENATAL VISIT NOTE  Subjective:  Joanne Roberts is a 36 y.o. [id number] at 3438w2d being seen today for ongoing prenatal care.  She is currently monitored for the following issues for this high-risk pregnancy and has Supervision of high risk pregnancy, antepartum, second trimester; Anemia complicating pregnancy; Advanced maternal age in multigravida; Gestational diabetes mellitus (GDM), antepartum; Obesity (BMI 30-39.9); Obesity in pregnancy; and Decreased fetal movements in third trimester on their problem list.  Patient reports occasional contractions, not regular. Also reports some leaking of fluid, she thinks it may eb urine but she is not sure. Also reports two brief episodes where fetus was "shaking" and resolved spontaneously, good fetal movement after.  Contractions: Irregular. Vag. Bleeding: None.  Movement: Present. Denies leaking of fluid.   DM  NPH breakfast 45 units  Reg 18 units   Dinner Reg 16 units  Bedtime NPH 18 units  FG: all except one < 95 PP: mostly under 120  The following portions of the patient's history were reviewed and updated as appropriate: allergies, current medications, past family history, past medical history, past social history, past surgical history and problem list. Problem list updated.  Objective:   Vitals:   12/14/16 0802  BP: (!) 106/55  Pulse: 77  Weight: 202 lb 12.8 oz (92 kg)    Fetal Status: Fetal Heart Rate (bpm): NST   Movement: Present     General:  Alert, oriented and cooperative. Patient is in no acute distress.  Skin: Skin is warm and dry. No rash noted.   Cardiovascular: Normal heart rate noted  Respiratory: Normal respiratory effort, no problems with respiration noted  Abdomen: Soft, gravid, appropriate for gestational age.  Pain/Pressure: Present     Pelvic: Cervical exam deferred      SSE: negative pooling, ferning, lactobacilli noted on slide  Extremities: Normal range of motion.     Mental Status:  Normal mood and  affect. Normal behavior. Normal judgment and thought content.   Assessment and Plan:  Pregnancy: [id number] at 5338w2d  1. Supervision of high risk pregnancy, antepartum, second trimester  2. Insulin controlled gestational diabetes mellitus (GDM) during pregnancy, antepartum NPH breakfast 45 units Reg 18 units Dinner Reg 16 units Bedtime NPH 20 units Cont this dose, well controlled  3. Anemia affecting pregnancy in third trimester  4. Elderly multigravida in third trimester  5. Vaginal fluid - no evidence of ROM - wet prep sent  6. Fetal "shaking" - to present to MAU if continues  Preterm labor symptoms and general obstetric precautions including but not limited to vaginal bleeding, contractions, leaking of fluid and fetal movement were reviewed in detail with the patient. Please refer to After Visit Summary for other counseling recommendations.  Return in about 8 days (around 12/22/2016) for weekly NST/BPP and HOB.   Conan BowensKelly M Davis, MD

## 2016-12-14 NOTE — Progress Notes (Signed)

## 2016-12-15 LAB — CERVICOVAGINAL ANCILLARY ONLY
BACTERIAL VAGINITIS: NEGATIVE
CANDIDA VAGINITIS: NEGATIVE
CHLAMYDIA, DNA PROBE: NEGATIVE
NEISSERIA GONORRHEA: NEGATIVE
Trichomonas: NEGATIVE

## 2016-12-22 ENCOUNTER — Encounter: Payer: Self-pay | Admitting: Advanced Practice Midwife

## 2016-12-22 ENCOUNTER — Encounter (HOSPITAL_COMMUNITY): Payer: Self-pay

## 2016-12-22 ENCOUNTER — Ambulatory Visit (INDEPENDENT_AMBULATORY_CARE_PROVIDER_SITE_OTHER): Payer: Self-pay | Admitting: Advanced Practice Midwife

## 2016-12-22 ENCOUNTER — Ambulatory Visit (HOSPITAL_COMMUNITY)
Admission: RE | Admit: 2016-12-22 | Discharge: 2016-12-22 | Disposition: A | Discharge status: Home / Self Care | Payer: Self-pay | Source: Ambulatory Visit | Attending: Internal Medicine | Admitting: Internal Medicine

## 2016-12-22 ENCOUNTER — Other Ambulatory Visit: Payer: Self-pay

## 2016-12-22 ENCOUNTER — Ambulatory Visit (INDEPENDENT_AMBULATORY_CARE_PROVIDER_SITE_OTHER): Payer: Self-pay | Admitting: *Deleted

## 2016-12-22 VITALS — BP 110/59 | HR 72 | Wt 202.1 lb

## 2016-12-22 DIAGNOSIS — O09523 Supervision of elderly multigravida, third trimester: Secondary | ICD-10-CM | POA: Insufficient documentation

## 2016-12-22 DIAGNOSIS — O0992 Supervision of high risk pregnancy, unspecified, second trimester: Secondary | ICD-10-CM

## 2016-12-22 DIAGNOSIS — O24414 Gestational diabetes mellitus in pregnancy, insulin controlled: Secondary | ICD-10-CM | POA: Insufficient documentation

## 2016-12-22 DIAGNOSIS — Z362 Encounter for other antenatal screening follow-up: Secondary | ICD-10-CM | POA: Insufficient documentation

## 2016-12-22 DIAGNOSIS — Z3A35 35 weeks gestation of pregnancy: Secondary | ICD-10-CM | POA: Insufficient documentation

## 2016-12-22 DIAGNOSIS — O0993 Supervision of high risk pregnancy, unspecified, third trimester: Secondary | ICD-10-CM

## 2016-12-22 DIAGNOSIS — O99013 Anemia complicating pregnancy, third trimester: Secondary | ICD-10-CM

## 2016-12-22 DIAGNOSIS — O99213 Obesity complicating pregnancy, third trimester: Secondary | ICD-10-CM | POA: Insufficient documentation

## 2016-12-22 DIAGNOSIS — R42 Dizziness and giddiness: Secondary | ICD-10-CM

## 2016-12-22 LAB — OB RESULTS CONSOLE GBS: GBS: NEGATIVE

## 2016-12-22 MED ORDER — FERROUS GLUCONATE 324 (38 FE) MG PO TABS: 324.0000 mg | ORAL_TABLET | Freq: Every day | ORAL | 3 refills | Status: AC

## 2016-12-22 MED ORDER — DOCUSATE SODIUM 100 MG PO CAPS: 100.0000 mg | ORAL_CAPSULE | Freq: Two times a day (BID) | ORAL | 2 refills | Status: DC | PRN

## 2016-12-22 NOTE — Progress Notes (Signed)
   PRENATAL VISIT NOTE  Subjective:  Joanne Roberts is a 36 y.o. [id number] at 71w3dbeing seen today for ongoing prenatal care.  She is currently monitored for the following issues for this high-risk pregnancy and has Supervision of high risk pregnancy, antepartum, second trimester; Anemia complicating pregnancy; Advanced maternal age in multigravida; Gestational diabetes mellitus (GDM), antepartum; Obesity (BMI 30-39.9); Obesity in pregnancy; and Decreased fetal movements in third trimester on their problem list.  Patient reports intermittent dizziness, cramping/contractions daily.  Contractions: Irregular. Vag. Bleeding: None.  Movement: Present. Denies leaking of fluid.   The following portions of the patient's history were reviewed and updated as appropriate: allergies, current medications, past family history, past medical history, past social history, past surgical history and problem list. Problem list updated.  Objective:   Vitals:   12/22/16 1125  BP: (!) 110/59  Pulse: 72  Weight: 202 lb 1.6 oz (91.7 kg)    Fetal Status: Fetal Heart Rate (bpm): NST   Movement: Present     General:  Alert, oriented and cooperative. Patient is in no acute distress.  Skin: Skin is warm and dry. No rash noted.   Cardiovascular: Normal heart rate noted  Respiratory: Normal respiratory effort, no problems with respiration noted  Abdomen: Soft, gravid, appropriate for gestational age.  Pain/Pressure: Present     Pelvic: Cervical exam performed        Extremities: Normal range of motion.     Mental Status:  Normal mood and affect. Normal behavior. Normal judgment and thought content.   Assessment and Plan:  Pregnancy: [id number] 377w3d1. Supervision of high risk pregnancy, antepartum, third trimester  - Strep Gp B NAA  2. Insulin controlled gestational diabetes mellitus (GDM) during pregnancy, antepartum --Fastings 69-104, mostly in 8050s PP 79-103 with one outlier on 11/22 of 140 --Well  controlled glucose on current insulin regimen.   --IOL at 39 weeks, pt reports she usually goes into labor ~38 weeks.  3. Elderly multigravida in third trimester   4. Dizziness  - CBC - Comp Met (CMET)  5. Anemia affecting pregnancy in third trimester --Pt not taking daily iron due to constipation. --Rx for for ferrous gluconate and Colace.   Preterm labor symptoms and general obstetric precautions including but not limited to vaginal bleeding, contractions, leaking of fluid and fetal movement were reviewed in detail with the patient. Please refer to After Visit Summary for other counseling recommendations.  Return in about 1 week (around 12/29/2016) for NST/BPP and HOB.   LiFatima BlankCNM

## 2016-12-22 NOTE — Progress Notes (Signed)
Pt had US for growth and BPP today @ [facility]. Pt states she is having more frequent UC's.  She is also having dizziness and seeing lights before getting out of bed in the morning.

## 2016-12-22 NOTE — Patient Instructions (Signed)
Gestational Diabetes Mellitus, Self Care Caring for yourself after you have been diagnosed with gestational diabetes (gestational diabetes mellitus) means keeping your blood sugar (glucose) under control with a balance of:  Nutrition.  Exercise.  Lifestyle changes.  Medicines or insulin, if necessary.  Support from your team of health care providers and others.  The following information explains what you need to know to manage your gestational diabetes at home. What do I need to do to manage my blood glucose?  Check your blood glucose every day during your pregnancy. Do this as often as told by your health care provider.  Contact your health care provider if your blood glucose is above your target for 2 tests in a row. Your health care provider will set individualized treatment goals for you. Generally, the goal of treatment is to maintain the following blood glucose levels during pregnancy:  After not eating for 8 hours (after fasting): at or below 95 mg/dL (5.3 mmol/L).  After meals (postprandial): ? One hour after a meal: at or below 140 mg/dL (7.8 mmol/L). ? Two hours after a meal: at or below 120 mg/dL (6.7 mmol/L).  A1c (hemoglobin A1c) level: 6-6.5%.  What do I need to know about hyperglycemia and hypoglycemia? What is hyperglycemia? Hyperglycemia, also called high blood glucose, occurs when blood glucose is too high. Make sure you know the early signs of hyperglycemia, such as:  Increased thirst.  Hunger.  Feeling very tired.  Needing to urinate more often than usual.  Blurry vision.  What is hypoglycemia? Hypoglycemia, also called low blood glucose, occurswith a blood glucose level at or below 70 mg/dL (3.9 mmol/L). The risk for hypoglycemia increases during or after exercise, during sleep, during illness, and when skipping meals or not eating for a long time (fasting). It is important to know the symptoms of hypoglycemia and treat it right away. Always have a  15-gram rapid-acting carbohydrate snack with you to treat low blood glucose.Family members and close friends should also know the symptoms and should understand how to treat hypoglycemia, in case you are not able to treat yourself. What are the symptoms of hypoglycemia? Hypoglycemia symptoms can include:  Hunger.  Anxiety.  Sweating and feeling clammy.  Confusion.  Dizziness or feeling light-headed.  Sleepiness.  Nausea.  Increased heart rate.  Headache.  Blurry vision.  Seizure.  Nightmares.  Tingling or numbness around the mouth, lips, or tongue.  A change in speech.  Decreased ability to concentrate.  A change in coordination.  Restless sleep.  Tremors or shakes.  Fainting.  Irritability.  How do I treat hypoglycemia?  If you are alert and able to swallow safely, follow the 15:15 rule:  Take 15 grams of a rapid-acting carbohydrate. Rapid-acting options include: ? 1 tube of glucose gel. ? 3 glucose pills. ? 6-8 pieces of hard candy. ? 4 oz (120 mL) of fruit juice. ? 4 oz (120 mL) of regular (not diet) soda.  Check your blood glucose 15 minutes after you take the carbohydrate.  If the repeat blood glucose level is still at or below 70 mg/dL (3.9 mmol/L), take 15 grams of a carbohydrate again.  If your blood glucose level does not increase above 70 mg/dL (3.9 mmol/L) after 3 tries, seek emergency medical care.  After your blood glucose level returns to normal, eat a meal or a snack within 1 hour.  How do I treat severe hypoglycemia? Severe hypoglycemia is when your blood glucose level is at or below 54 mg/dL (  3 mmol/L). Severe hypoglycemia is an emergency. Do not wait to see if the symptoms will go away. Get medical help right away. Call your local emergency services (911 in the [place] Do not drive yourself to the hospital. If you have severe hypoglycemia and you cannot eat or drink, you may need an injection of glucagon. A family member or close  friend should learn how to check your blood glucose and how to give you a glucagon injection. Ask your health care provider if you need to have an emergency glucagon injection kit available. Severe hypoglycemia may need to be treated in a hospital. The treatment may include getting glucose through an IV tube. You may also need treatment for the cause of your hypoglycemia. What else can I do to manage my gestational diabetes? Take your diabetes medicines as told  If your health care provider prescribed insulin or diabetes medicines, take them every day.  Do not run out of insulin or other diabetes medicines that you take. Plan ahead so you always have these available.  If you use insulin, adjust your dosage based on how physically active you are and what foods you eat. Your health care provider will tell you how to adjust your dosage. Make healthy food choices  The things that you eat and drink affect your blood glucose. Making good choices helps to control your diabetes and prevent other health problems. A healthy meal plan includes eating lean proteins, complex carbohydrates, fresh fruits and vegetables, low-fat dairy products, and healthy fats. Make an appointment to see a diet and nutrition specialist (registered dietitian) to help you create an eating plan that is right for you. Make sure that you:  Follow instructions from your health care provider about eating or drinking restrictions.  Drink enough fluid to keep your urine clear or pale yellow.  Eat healthy snacks between nutritious meals.  Track the carbohydrates that you eat. Do this by reading food labels and learning the standard serving sizes of foods.  Follow your sick day plan whenever you cannot eat or drink as usual. Make this plan in advance with your health care provider.  Stay active   Do at least 30 minutes of physical activity a day, or as much physical activity as your health care provider recommends during your  pregnancy. ? Doing 10 minutes of exercise 30 minutes after each meal may help to control postprandial blood glucose levels.  If you start a new exercise or activity, work with your health care provider to adjust your insulin, medicines, or food intake as needed. Make healthy lifestyle choices  Do not drink alcohol.  Do not use any tobacco products, such as cigarettes, chewing tobacco, and e-cigarettes. If you need help quitting, ask your health care provider.  Learn to manage stress. If you need help with this, ask your health care provider. Care for your body  Keep your immunizations up to date.  Brush your teeth and gums two times a day, and floss at least one time a day.  Visit your dentist at least once every 6 months.  Maintain a healthy weight during your pregnancy. General instructions   Take over-the-counter and prescription medicines only as told by your health care provider.  Talk with your health care provider about your risk for high blood pressure during pregnancy (preeclampsia or eclampsia).  Share your diabetes management plan with people in your workplace, school, and household.  Check your urine for ketones during your pregnancy when you are ill and   as told by your health care provider.  Carry a medical alert card or wear medical alert jewelry.  Ask your health care provider: ? Do I need to meet with a diabetes educator? ? Where can I find a support group for people with diabetes?  Keep all follow-up visits during your pregnancy (prenatal) and after delivery (postnatal) as told by your health care provider. This is important. Get the care that you need after delivery  Have your blood glucose level checked 4-12 weeks after delivery. This is done with an oral glucose tolerance test (OGTT).  Get screened for diabetes at least every 3 years, or as often as told by your health care provider. Where to find more information: To learn more about gestational  diabetes, visit:  American Diabetes Association (ADA): www.diabetes.org/diabetes-basics/gestational  Centers for Disease Control and Prevention (CDC): www.cdc.gov/diabetes/pubs/pdf/gestationalDiabetes.pdf  This information is not intended to replace advice given to you by your health care provider. Make sure you discuss any questions you have with your health care provider. Document Released: 04/22/2015 Document Revised: 06/06/2015 Document Reviewed: 02/01/2015 Elsevier Interactive Patient Education  2017 [employer]  

## 2016-12-23 LAB — CBC
Hematocrit: 30.2 % — ABNORMAL LOW (ref 34.0–46.6)
Hemoglobin: 9.6 g/dL — ABNORMAL LOW (ref 11.1–15.9)
MCH: 23.5 pg — ABNORMAL LOW (ref 26.6–33.0)
MCHC: 31.8 g/dL (ref 31.5–35.7)
MCV: 74 fL — AB (ref 79–97)
PLATELETS: 207 10*3/uL (ref 150–379)
RBC: 4.08 x10E6/uL (ref 3.77–5.28)
RDW: 18.8 % — AB (ref 12.3–15.4)
WBC: 7.2 10*3/uL (ref 3.4–10.8)

## 2016-12-23 LAB — COMPREHENSIVE METABOLIC PANEL
A/G RATIO: 0.9 — AB (ref 1.2–2.2)
ALK PHOS: 211 IU/L — AB (ref 39–117)
ALT: 10 IU/L (ref 0–32)
AST: 15 IU/L (ref 0–40)
Albumin: 3.3 g/dL — ABNORMAL LOW (ref 3.5–5.5)
BILIRUBIN TOTAL: 0.3 mg/dL (ref 0.0–1.2)
BUN/Creatinine Ratio: 16 (ref 9–23)
BUN: 8 mg/dL (ref 6–20)
CALCIUM: 8.6 mg/dL — AB (ref 8.7–10.2)
CHLORIDE: 104 mmol/L (ref 96–106)
CO2: 21 mmol/L (ref 20–29)
Creatinine, Ser: 0.51 mg/dL — ABNORMAL LOW (ref 0.57–1.00)
GFR calc Af Amer: 144 mL/min/{1.73_m2} (ref >59)
GFR, EST NON AFRICAN AMERICAN: 125 mL/min/{1.73_m2} (ref >59)
GLOBULIN, TOTAL: 3.6 g/dL (ref 1.5–4.5)
Glucose: 87 mg/dL (ref 65–99)
POTASSIUM: 3.9 mmol/L (ref 3.5–5.2)
SODIUM: 137 mmol/L (ref 134–144)
Total Protein: 6.9 g/dL (ref 6.0–8.5)

## 2016-12-24 LAB — STREP GP B NAA: Strep Gp B NAA: NEGATIVE

## 2016-12-29 ENCOUNTER — Ambulatory Visit (INDEPENDENT_AMBULATORY_CARE_PROVIDER_SITE_OTHER): Payer: Self-pay | Admitting: *Deleted

## 2016-12-29 ENCOUNTER — Ambulatory Visit (INDEPENDENT_AMBULATORY_CARE_PROVIDER_SITE_OTHER): Payer: Self-pay | Admitting: Obstetrics & Gynecology

## 2016-12-29 ENCOUNTER — Ambulatory Visit: Payer: Self-pay

## 2016-12-29 ENCOUNTER — Encounter: Payer: Self-pay | Admitting: Obstetrics & Gynecology

## 2016-12-29 VITALS — BP 114/71 | HR 63 | Wt 204.4 lb

## 2016-12-29 DIAGNOSIS — O0992 Supervision of high risk pregnancy, unspecified, second trimester: Secondary | ICD-10-CM

## 2016-12-29 DIAGNOSIS — O24414 Gestational diabetes mellitus in pregnancy, insulin controlled: Secondary | ICD-10-CM

## 2016-12-29 DIAGNOSIS — O0993 Supervision of high risk pregnancy, unspecified, third trimester: Secondary | ICD-10-CM

## 2016-12-29 DIAGNOSIS — O99210 Obesity complicating pregnancy, unspecified trimester: Secondary | ICD-10-CM

## 2016-12-29 DIAGNOSIS — O09523 Supervision of elderly multigravida, third trimester: Secondary | ICD-10-CM

## 2016-12-29 NOTE — Progress Notes (Signed)
   PRENATAL VISIT NOTE  Subjective:  Joanne Roberts is a 36 y.o. [id number] at 3361w3d being seen today for ongoing prenatal care.  She is currently monitored for the following issues for this high-risk pregnancy and has Supervision of high risk pregnancy, antepartum, second trimester; Anemia complicating pregnancy; Advanced maternal age in multigravida; Gestational diabetes mellitus (GDM), antepartum; Obesity (BMI 30-39.9); Obesity in pregnancy; and Decreased fetal movements in third trimester on their problem list.  Patient reports reports a month of urinary incontinence, especially in the morning.  Contractions: Irregular. Vag. Bleeding: None.  Movement: Present. Denies leaking of fluid.   The following portions of the patient's history were reviewed and updated as appropriate: allergies, current medications, past family history, past medical history, past social history, past surgical history and problem list. Problem list updated.  Objective:   Vitals:   12/29/16 0853  BP: 114/71  Pulse: 63  Weight: 204 lb 6.4 oz (92.7 kg)    Fetal Status: Fetal Heart Rate (bpm): NST   Movement: Present     General:  Alert, oriented and cooperative. Patient is in no acute distress.  Skin: Skin is warm and dry. No rash noted.   Cardiovascular: Normal heart rate noted  Respiratory: Normal respiratory effort, no problems with respiration noted  Abdomen: Soft, gravid, appropriate for gestational age.  Pain/Pressure: Present     Pelvic: Cervical exam performed        Extremities: Normal range of motion.     Mental Status:  Normal mood and affect. Normal behavior. Normal judgment and thought content.   Assessment and Plan:  Pregnancy: [id number] at 7561w3d  1. Supervision of high risk pregnancy, antepartum, third trimester    2. Insulin controlled gestational diabetes mellitus (GDM) during pregnancy, antepartum - Continue testing - Excellent sugars recorded  3. Elderly multigravida in third  trimester -normal NIPS  4. Supervision of high risk pregnancy, antepartum, second trimester   5. Obesity in pregnancy   Preterm labor symptoms and general obstetric precautions including but not limited to vaginal bleeding, contractions, leaking of fluid and fetal movement were reviewed in detail with the patient. Please refer to After Visit Summary for other counseling recommendations.  Return in about 1 week (around 01/05/2017) for NST and HOB.   Allie BossierMyra C Windsor Goeken, MD

## 2016-12-29 NOTE — Progress Notes (Signed)

## 2016-12-29 NOTE — Progress Notes (Signed)
Pt states that she gets dizzy after taking the iron pills.

## 2017-01-07 ENCOUNTER — Ambulatory Visit (INDEPENDENT_AMBULATORY_CARE_PROVIDER_SITE_OTHER): Payer: Self-pay | Admitting: General Practice

## 2017-01-07 ENCOUNTER — Ambulatory Visit (HOSPITAL_COMMUNITY)
Admission: RE | Admit: 2017-01-07 | Discharge: 2017-01-07 | Disposition: A | Discharge status: Home / Self Care | Payer: Self-pay | Source: Ambulatory Visit | Attending: Obstetrics & Gynecology | Admitting: Obstetrics & Gynecology

## 2017-01-07 ENCOUNTER — Encounter: Payer: Self-pay | Admitting: Obstetrics and Gynecology

## 2017-01-07 ENCOUNTER — Ambulatory Visit (INDEPENDENT_AMBULATORY_CARE_PROVIDER_SITE_OTHER): Payer: Self-pay | Admitting: Obstetrics and Gynecology

## 2017-01-07 VITALS — BP 117/60 | HR 60 | Wt 206.0 lb

## 2017-01-07 DIAGNOSIS — O24414 Gestational diabetes mellitus in pregnancy, insulin controlled: Secondary | ICD-10-CM

## 2017-01-07 DIAGNOSIS — O0992 Supervision of high risk pregnancy, unspecified, second trimester: Secondary | ICD-10-CM

## 2017-01-07 DIAGNOSIS — Z3A37 37 weeks gestation of pregnancy: Secondary | ICD-10-CM | POA: Insufficient documentation

## 2017-01-07 DIAGNOSIS — O09523 Supervision of elderly multigravida, third trimester: Secondary | ICD-10-CM | POA: Insufficient documentation

## 2017-01-07 DIAGNOSIS — O99213 Obesity complicating pregnancy, third trimester: Secondary | ICD-10-CM | POA: Insufficient documentation

## 2017-01-07 NOTE — Progress Notes (Signed)
IOL scheduled 1/5 @ 630

## 2017-01-07 NOTE — Progress Notes (Signed)
   PRENATAL VISIT NOTE  Subjective:  Joanne Roberts is a 36 y.o. [id number] at 1420w5d being seen today for ongoing prenatal care.  She is currently monitored for the following issues for this high-risk pregnancy and has Supervision of high risk pregnancy, antepartum, second trimester; Anemia complicating pregnancy; Advanced maternal age in multigravida; Gestational diabetes mellitus (GDM), antepartum; Obesity (BMI 30-39.9); Obesity in pregnancy; and Decreased fetal movements in third trimester on their problem list.  Patient reports pelvic pressure.  Contractions: Irregular. Vag. Bleeding: None.  Movement: Present. Denies leaking of fluid.   gDM on insulin FG: mildly elevated on Christmas PP: well controlled  The following portions of the patient's history were reviewed and updated as appropriate: allergies, current medications, past family history, past medical history, past social history, past surgical history and problem list. Problem list updated.  Objective:   Vitals:   01/07/17 1503  BP: 117/60  Pulse: 60  Weight: 206 lb (93.4 kg)    Fetal Status: Fetal Heart Rate (bpm): nst   Movement: Present     General:  Alert, oriented and cooperative. Patient is in no acute distress.  Skin: Skin is warm and dry. No rash noted.   Cardiovascular: Normal heart rate noted  Respiratory: Normal respiratory effort, no problems with respiration noted  Abdomen: Soft, gravid, appropriate for gestational age.  Pain/Pressure: Present     Pelvic: Cervical exam deferred        Extremities: Normal range of motion.  Edema: Trace  Mental Status:  Normal mood and affect. Normal behavior. Normal judgment and thought content.   Assessment and Plan:  Pregnancy: [id number] at 3520w5d  1. Insulin controlled gestational diabetes mellitus (GDM) during pregnancy, antepartum Fairly well controlled Cont current regimen NST reactive today For MFM BPP today IOL scheduled for 39 weeks  2. Elderly multigravida  in third trimester  3. Supervision of high risk pregnancy, antepartum, second trimester   Term labor symptoms and general obstetric precautions including but not limited to vaginal bleeding, contractions, leaking of fluid and fetal movement were reviewed in detail with the patient. Please refer to After Visit Summary for other counseling recommendations.  Return in about 1 week (around 01/14/2017) for OB visit.   Conan BowensKelly M Sister Carbone, MD

## 2017-01-08 ENCOUNTER — Telehealth (HOSPITAL_COMMUNITY): Payer: Self-pay | Admitting: *Deleted

## 2017-01-08 ENCOUNTER — Encounter (HOSPITAL_COMMUNITY): Payer: Self-pay | Admitting: *Deleted

## 2017-01-08 NOTE — Telephone Encounter (Signed)
Preadmission screen  

## 2017-01-12 NOTE — L&D Delivery Note (Signed)
Delivery Note At 1:06 AM a viable female was delivered via  (Presentation: LOA).  APGAR: 8, 9; weight pending.   Placenta status: Spontaneous, intact.  Cord: 3 vessels  After delivery of placenta, fundus initially firm. With recheck, fundus boggy and large gush of bright red bleeding followed. Fundus did not firm with massage, 800mg  PO cytotec given. Fundus firmed with massage and bleeding stopped.  Anesthesia:  epidural Episiotomy:  none Lacerations:  2nd degree perineal Suture Repair: 3.0 vicryl Est. Blood Loss (mL):  350  Mom to postpartum.  Baby to Couplet care / Skin to Skin.  Rolm BookbinderCaroline M Neill CNM 01/17/2017, 1:30 AM  Please schedule this patient for Postpartum visit in: 4 weeks with the following provider: Any provider For C/S patients schedule nurse incision check in weeks 2 weeks: no High risk pregnancy complicated by: GDM Delivery mode:  SVD Anticipated Birth Control:  husband vasectomy PP Procedures needed: n/a  Schedule Integrated BH visit: no

## 2017-01-13 ENCOUNTER — Other Ambulatory Visit: Payer: Self-pay | Admitting: Advanced Practice Midwife

## 2017-01-15 ENCOUNTER — Ambulatory Visit (INDEPENDENT_AMBULATORY_CARE_PROVIDER_SITE_OTHER): Payer: Self-pay | Admitting: Obstetrics and Gynecology

## 2017-01-15 ENCOUNTER — Ambulatory Visit: Payer: Self-pay

## 2017-01-15 ENCOUNTER — Encounter: Payer: Self-pay | Admitting: Obstetrics and Gynecology

## 2017-01-15 ENCOUNTER — Ambulatory Visit (INDEPENDENT_AMBULATORY_CARE_PROVIDER_SITE_OTHER): Payer: Self-pay | Admitting: *Deleted

## 2017-01-15 VITALS — BP 112/70 | HR 61 | Wt 206.3 lb

## 2017-01-15 DIAGNOSIS — O09523 Supervision of elderly multigravida, third trimester: Secondary | ICD-10-CM

## 2017-01-15 DIAGNOSIS — O0992 Supervision of high risk pregnancy, unspecified, second trimester: Secondary | ICD-10-CM

## 2017-01-15 DIAGNOSIS — N898 Other specified noninflammatory disorders of vagina: Secondary | ICD-10-CM

## 2017-01-15 DIAGNOSIS — O24414 Gestational diabetes mellitus in pregnancy, insulin controlled: Secondary | ICD-10-CM

## 2017-01-15 LAB — POCT URINALYSIS DIP (DEVICE)
GLUCOSE, UA: NEGATIVE mg/dL
HGB URINE DIPSTICK: NEGATIVE
KETONES UR: NEGATIVE mg/dL
Nitrite: NEGATIVE
PROTEIN: 100 mg/dL — AB
SPECIFIC GRAVITY, URINE: 1.025 (ref 1.005–1.030)
Urobilinogen, UA: 1 mg/dL (ref 0.0–1.0)
pH: 7 (ref 5.0–8.0)

## 2017-01-15 NOTE — Progress Notes (Signed)
   PRENATAL VISIT NOTE  Subjective:  Joanne Roberts is a 37 y.o. [id number] at 6469w6d being seen today for ongoing prenatal care.  She is currently monitored for the following issues for this high-risk pregnancy and has Supervision of high risk pregnancy, antepartum, second trimester; Anemia complicating pregnancy; Advanced maternal age in multigravida; Gestational diabetes mellitus (GDM), antepartum; Obesity (BMI 30-39.9); Obesity in pregnancy; and Decreased fetal movements in third trimester on their problem list.  Patient reports green discharge.  Contractions: Irregular. Vag. Bleeding: None.  Movement: Present. Denies leaking of fluid.   The following portions of the patient's history were reviewed and updated as appropriate: allergies, current medications, past family history, past medical history, past social history, past surgical history and problem list. Problem list updated.  Objective:   Vitals:   01/15/17 0929  BP: 112/70  Pulse: 61  Weight: 206 lb 4.8 oz (93.6 kg)    Fetal Status: Fetal Heart Rate (bpm): NST   Movement: Present     General:  Alert, oriented and cooperative. Patient is in no acute distress.  Skin: Skin is warm and dry. No rash noted.   Cardiovascular: Normal heart rate noted  Respiratory: Normal respiratory effort, no problems with respiration noted  Abdomen: Soft, gravid, appropriate for gestational age.  Pain/Pressure: Present     Pelvic: Cervical exam deferred        Extremities: Normal range of motion.     Mental Status:  Normal mood and affect. Normal behavior. Normal judgment and thought content.   Assessment and Plan:  Pregnancy: [id number] at 3069w6d  1. Supervision of high risk pregnancy, antepartum, second trimester IOL scheduled for 01/16/17  2. Elderly multigravida in third trimester  3. Insulin controlled gestational diabetes mellitus (GDM) during pregnancy, antepartum BPP 10/10 today Fairly well controlled Cont current regimen   Term  labor symptoms and general obstetric precautions including but not limited to vaginal bleeding, contractions, leaking of fluid and fetal movement were reviewed in detail with the patient. Please refer to After Visit Summary for other counseling recommendations.  Return in about 5 weeks (around 02/19/2017) for post partum check.   Conan BowensKelly M Juni Glaab, MD

## 2017-01-15 NOTE — Progress Notes (Signed)

## 2017-01-15 NOTE — Progress Notes (Signed)
Pt reports having white and green vaginal D/C.  IOL scheduled for tomorrow.

## 2017-01-16 ENCOUNTER — Inpatient Hospital Stay (HOSPITAL_COMMUNITY): Payer: Medicaid Other | Admitting: Anesthesiology

## 2017-01-16 ENCOUNTER — Inpatient Hospital Stay (HOSPITAL_COMMUNITY)
Admission: RE | Admit: 2017-01-16 | Discharge: 2017-01-18 | DRG: 807 | Disposition: A | Discharge status: Home / Self Care | Payer: Medicaid Other | Source: Ambulatory Visit | Attending: Obstetrics and Gynecology | Admitting: Obstetrics and Gynecology

## 2017-01-16 DIAGNOSIS — O09523 Supervision of elderly multigravida, third trimester: Secondary | ICD-10-CM

## 2017-01-16 DIAGNOSIS — O24424 Gestational diabetes mellitus in childbirth, insulin controlled: Principal | ICD-10-CM | POA: Diagnosis present

## 2017-01-16 DIAGNOSIS — D649 Anemia, unspecified: Secondary | ICD-10-CM | POA: Diagnosis present

## 2017-01-16 DIAGNOSIS — O99214 Obesity complicating childbirth: Secondary | ICD-10-CM | POA: Diagnosis present

## 2017-01-16 DIAGNOSIS — O24419 Gestational diabetes mellitus in pregnancy, unspecified control: Secondary | ICD-10-CM | POA: Diagnosis present

## 2017-01-16 DIAGNOSIS — Z3A39 39 weeks gestation of pregnancy: Secondary | ICD-10-CM

## 2017-01-16 DIAGNOSIS — E669 Obesity, unspecified: Secondary | ICD-10-CM | POA: Diagnosis present

## 2017-01-16 DIAGNOSIS — O9902 Anemia complicating childbirth: Secondary | ICD-10-CM | POA: Diagnosis present

## 2017-01-16 LAB — TYPE AND SCREEN
ABO/RH(D): A POS
ANTIBODY SCREEN: NEGATIVE

## 2017-01-16 LAB — GLUCOSE, CAPILLARY
GLUCOSE-CAPILLARY: 70 mg/dL (ref 65–99)
GLUCOSE-CAPILLARY: 69 mg/dL (ref 65–99)
GLUCOSE-CAPILLARY: 68 mg/dL (ref 65–99)
Glucose-Capillary: 72 mg/dL (ref 65–99)
Glucose-Capillary: 62 mg/dL — ABNORMAL LOW (ref 65–99)

## 2017-01-16 LAB — CBC
HCT: 30.8 % — ABNORMAL LOW (ref 36.0–46.0)
Hemoglobin: 9.6 g/dL — ABNORMAL LOW (ref 12.0–15.0)
MCH: 23.7 pg — AB (ref 26.0–34.0)
MCHC: 31.2 g/dL (ref 30.0–36.0)
MCV: 76 fL — AB (ref 78.0–100.0)
Platelets: 199 10*3/uL (ref 150–400)
RBC: 4.05 MIL/uL (ref 3.87–5.11)
RDW: 18.9 % — AB (ref 11.5–15.5)
WBC: 7.7 10*3/uL (ref 4.0–10.5)

## 2017-01-16 MED ORDER — EPHEDRINE 5 MG/ML INJ
10.0000 mg | INTRAVENOUS | Status: DC | PRN
  Filled 2017-01-16: qty 2

## 2017-01-16 MED ORDER — TERBUTALINE SULFATE 1 MG/ML IJ SOLN
0.2500 mg | Freq: Once | INTRAMUSCULAR | Status: DC | PRN
  Filled 2017-01-16: qty 1

## 2017-01-16 MED ORDER — PHENYLEPHRINE 40 MCG/ML (10ML) SYRINGE FOR IV PUSH (FOR BLOOD PRESSURE SUPPORT)
80.0000 ug | PREFILLED_SYRINGE | INTRAVENOUS | Status: DC | PRN
  Filled 2017-01-16: qty 5

## 2017-01-16 MED ORDER — LIDOCAINE HCL (PF) 1 % IJ SOLN
30.0000 mL | INTRAMUSCULAR | Status: DC | PRN
  Filled 2017-01-16: qty 30

## 2017-01-16 MED ORDER — LACTATED RINGERS IV SOLN: 500.0000 mL | INTRAVENOUS | Status: DC | PRN

## 2017-01-16 MED ORDER — DIPHENHYDRAMINE HCL 50 MG/ML IJ SOLN: 12.5000 mg | INTRAMUSCULAR | Status: DC | PRN

## 2017-01-16 MED ORDER — SOD CITRATE-CITRIC ACID 500-334 MG/5ML PO SOLN: 30.0000 mL | ORAL | Status: DC | PRN

## 2017-01-16 MED ORDER — LACTATED RINGERS IV SOLN
INTRAVENOUS | Status: DC
  Administered 2017-01-16 – 2017-01-17 (×4): via INTRAVENOUS

## 2017-01-16 MED ORDER — OXYCODONE-ACETAMINOPHEN 5-325 MG PO TABS: 1.0000 | ORAL_TABLET | ORAL | Status: DC | PRN

## 2017-01-16 MED ORDER — ONDANSETRON HCL 4 MG/2ML IJ SOLN: 4.0000 mg | Freq: Four times a day (QID) | INTRAMUSCULAR | Status: DC | PRN

## 2017-01-16 MED ORDER — LACTATED RINGERS IV SOLN: 500.0000 mL | Freq: Once | INTRAVENOUS | Status: DC

## 2017-01-16 MED ORDER — OXYCODONE-ACETAMINOPHEN 5-325 MG PO TABS: 2.0000 | ORAL_TABLET | ORAL | Status: DC | PRN

## 2017-01-16 MED ORDER — OXYTOCIN 40 UNITS IN LACTATED RINGERS INFUSION - SIMPLE MED: 2.5000 [IU]/h | INTRAVENOUS | Status: DC

## 2017-01-16 MED ORDER — OXYTOCIN 40 UNITS IN LACTATED RINGERS INFUSION - SIMPLE MED
1.0000 m[IU]/min | INTRAVENOUS | Status: DC
  Administered 2017-01-16: 2 m[IU]/min via INTRAVENOUS
  Filled 2017-01-16: qty 1000

## 2017-01-16 MED ORDER — FENTANYL CITRATE (PF) 100 MCG/2ML IJ SOLN
100.0000 ug | INTRAMUSCULAR | Status: DC | PRN
  Administered 2017-01-16 (×2): 100 ug via INTRAVENOUS
  Filled 2017-01-16 (×2): qty 2

## 2017-01-16 MED ORDER — FENTANYL 2.5 MCG/ML BUPIVACAINE 1/10 % EPIDURAL INFUSION (WH - ANES)
14.0000 mL/h | INTRAMUSCULAR | Status: DC | PRN
  Administered 2017-01-16: 14 mL/h via EPIDURAL

## 2017-01-16 MED ORDER — ACETAMINOPHEN 325 MG PO TABS: 650.0000 mg | ORAL_TABLET | ORAL | Status: DC | PRN

## 2017-01-16 MED ORDER — PHENYLEPHRINE 40 MCG/ML (10ML) SYRINGE FOR IV PUSH (FOR BLOOD PRESSURE SUPPORT)
PREFILLED_SYRINGE | INTRAVENOUS | Status: AC
  Filled 2017-01-16: qty 20

## 2017-01-16 MED ORDER — FENTANYL 2.5 MCG/ML BUPIVACAINE 1/10 % EPIDURAL INFUSION (WH - ANES)
INTRAMUSCULAR | Status: AC
  Filled 2017-01-16: qty 100

## 2017-01-16 MED ORDER — MISOPROSTOL 25 MCG QUARTER TABLET
25.0000 ug | ORAL_TABLET | ORAL | Status: DC | PRN
  Administered 2017-01-16 (×2): 25 ug via VAGINAL
  Filled 2017-01-16 (×2): qty 1

## 2017-01-16 MED ORDER — OXYTOCIN BOLUS FROM INFUSION
500.0000 mL | Freq: Once | INTRAVENOUS | Status: AC
  Administered 2017-01-17: 500 mL via INTRAVENOUS

## 2017-01-16 NOTE — H&P (Signed)
LABOR AND DELIVERY ADMISSION HISTORY AND PHYSICAL NOTE  Joanne Roberts is a 37 y.o. female [phone] with IUP at 62w0dby ultrasound presenting for induction of labor due to A2 GDM (on insulin).  She reports positive fetal movement. She denies leakage of fluid or vaginal bleeding.  [facility]Com[facility] Pregnancy complications:  - A2 GDM - Advanced maternal age in [place]- Obesity   Past Medical History: Past Medical History:  Diagnosis Date  . Anemia   . Gestational diabetes    insulin  . Gestational diabetes mellitus (GDM), antepartum 10/08/2016   Current Diabetic Medications:  Regular 16 units before breakfast, 20 before dinner NPH 45 units before breakfast, 18 units at bedtime [NA] Aspirin 81 mg daily after 12 weeks; discontinue after 36 weeks (? A2/B GDM)  Required Referrals for A1GDM or A2GDM: '[x]'$  Diabetes Education and Testing Supplies '[x]'$  Nutrition Cousult  Baseline and surveillance labs (pulled in from EPIC, refresh links as needed)     Past Surgical History: Past Surgical History:  Procedure Laterality Date  . NO PAST SURGERIES      Obstetrical History: OB History    Gravida Para Term Preterm AB Living   '5 3 3 '$ 0 1 3   SAB TAB Ectopic Multiple Live Births   0 1 0 0 3      Social History: Social History   Socioeconomic History  . Marital status: Married    Spouse name: Not on file  . Number of children: Not on file  . Years of education: Not on file  . Highest education level: Not on file  Social Needs  . Financial resource strain: Not on file  . Food insecurity - worry: Not on file  . Food insecurity - inability: Not on file  . Transportation needs - medical: Not on file  . Transportation needs - non-medical: Not on file  Occupational History  . Not on file  Tobacco Use  . Smoking status: Never Smoker  . Smokeless tobacco: Never Used  Substance and Sexual Activity  . Alcohol use: No  . Drug use:  No  . Sexual activity: Not Currently  Other Topics Concern  . Not on file  Social History Narrative  . Not on file    Family History: Family History  Problem Relation Age of Onset  . Diabetes Father     Allergies: No Known Allergies  Medications Prior to Admission  Medication Sig Dispense Refill Last Dose  . docusate sodium (COLACE) 100 MG capsule Take 1 capsule (100 mg total) by mouth 2 (two) times daily as needed. 30 capsule 2 Past Week at Unknown time  . ferrous gluconate (FERGON) 324 MG tablet Take 1 tablet (324 mg total) by mouth daily with breakfast. 30 tablet 3 Past Week at Unknown time  . insulin NPH Human (HUMULIN N,NOVOLIN N) 100 UNIT/ML injection 40 units before breakfast & 13 units at bedtime (Patient taking differently: Inject 20-45 Units into the skin 2 (two) times daily before a meal. ) 10 mL 11 01/15/2017 at Unknown time  . insulin regular (NOVOLIN R,HUMULIN R) 100 units/mL injection 16 units before breakfast & 13 units before dinner (Patient taking differently: Inject 16-18 Units into the skin 2 (two) times daily before a meal. 18 units before breakfast & 16 units before dinner) 10 mL 11 01/15/2017 at Unknown time  . Prenatal Vit-Fe Fumarate-FA (PRENATAL MULTIVITAMIN) TABS Take 1 tablet by mouth every evening.   Past Week at Unknown time  .  ACCU-CHEK FASTCLIX LANCETS MISC 1 Units by Percutaneous route 4 (four) times daily. 100 each 12 Taking  . Blood Glucose Monitoring Suppl (ACCU-CHEK NANO SMARTVIEW) w/Device KIT 1 kit by Subdermal route as directed. Check blood sugars for fasting, and two hours after breakfast, lunch and dinner (4 checks daily) 1 kit 0 Taking  . glucose blood (ACCU-CHEK SMARTVIEW) test strip Use as instructed to check blood sugars 100 each 12 Taking  . Insulin Syringe-Needle U-100 (INSULIN SYRINGE 1CC/31GX5/16") 31G X 5/16" 1 ML MISC 1 Device by Does not apply route 4 (four) times daily. 120 each 7 Taking     Review of Systems  All systems reviewed and  negative except as stated in HPI  Physical Exam Blood pressure (!) 119/58, pulse (!) 52, temperature 98.3 F (36.8 C), temperature source Oral, resp. rate 18, height '5\' 4"'$  (1.626 m), weight 98.9 kg (218 lb), last menstrual period 03/14/2016, currently breastfeeding. General appearance: alert, oriented, NAD Lungs: normal respiratory effort, CTAB Heart: RRR Abdomen: soft, non-tender; gravid, FH appropriate for GA (120) Extremities: No calf swelling or tenderness Presentation: vertex, slightly oblique (confirmed with ultrasound) Cervix: dilated 1.5cm at internal os, 30% effaced  Fetal monitoring: FHR 120, moderate variability, no decels  Uterine activity: minimal contractions  Dilation: 1.5 Effacement (%): 30 Station: Ballotable Exam by:: Dr. Lambert Keto  Prenatal labs: ABO, Rh: --/--/A POS (01/05 0748)A positive blood type  Antibody: NEG (01/05 0748) Rubella: Immune (09/06 0000) RPR: Non Reactive (10/18 1000)  HBsAg: Negative (09/06 0000)  HIV: Non Reactive (10/18 1000)  GC/Chlamydia: Negative GBS: Negative (12/11 1204)  1-hr GTT: 232 Genetic screening:  NIPS normal  Anatomy US: normal   Prenatal Transfer Tool  Maternal Diabetes: Yes:  Diabetes Type:  Insulin/Medication controlled Genetic Screening: Normal Maternal Ultrasounds/Referrals: Normal Fetal Ultrasounds or other Referrals:  None Maternal Substance Abuse:  No Significant Maternal Medications:  Meds include: Other: Insulin  Significant Maternal Lab Results: Lab values include: Group B Strep negative  Results for orders placed or performed during the hospital encounter of 01/16/17 (from the past 24 hour(s))  Glucose, capillary   Collection Time: 01/16/17  7:40 AM  Result Value Ref Range   Glucose-Capillary 72 65 - 99 mg/dL  CBC   Collection Time: 01/16/17  7:48 AM  Result Value Ref Range   WBC 7.7 4.0 - 10.5 K/uL   RBC 4.05 3.87 - 5.11 MIL/uL   Hemoglobin 9.6 (L) 12.0 - 15.0 g/dL   HCT 30.8 (L) 36.0 - 46.0 %   MCV  76.0 (L) 78.0 - 100.0 fL   MCH 23.7 (L) 26.0 - 34.0 pg   MCHC 31.2 30.0 - 36.0 g/dL   RDW 18.9 (H) 11.5 - 15.5 %   Platelets 199 150 - 400 K/uL  Type and screen   Collection Time: 01/16/17  7:48 AM  Result Value Ref Range   ABO/RH(D) A POS    Antibody Screen NEG    Sample Expiration 01/19/2017     Patient Active Problem List   Diagnosis Date Noted  . Gestational diabetes 01/16/2017  . Decreased fetal movements in third trimester 11/19/2016  . Obesity (BMI 30-39.9) 10/13/2016  . Obesity in pregnancy 10/13/2016  . Gestational diabetes mellitus (GDM), antepartum 10/08/2016  . Advanced maternal age in multigravida 09/29/2016  . Anemia complicating pregnancy 62/22/9798  . Supervision of high risk pregnancy, antepartum, second trimester 04/08/2011    Assessment: Lynel Forester is a 37 y.o. [id number] at 18w0dhere for induction of labor due to A2 GDM.  Patient presents with no concerns at this time. Patient had previous pregnancy where induction was considered by active labor began prior to induction.   #Labor: cytotec vaginally  #Pain: Acetaminophen, fentanyl q1hr prn, percocet prn  #FWB: Category 1  #ID:  GBS neg  #MOF: breast #MOC: vasectomy  #Circ:  None   Sherin Abraham 01/16/2017, 11:32 AM  OB FELLOW HISTORY AND PHYSICAL ATTESTATION I have seen and examined this patient; I agree with above documentation in the resident's note.   IOL for A2GDM --Labor: cytotec started for cervical ripening. Discussed FB, pt would like to hold off --A2GDM: monitor CBG q4h, then q2h when in active labor; goal CBG 70-126  Gailen Shelter, MD OB Fellow 01/16/2017, 11:49 AM

## 2017-01-16 NOTE — Anesthesia Pain Management Evaluation Note (Addendum)
  CRNA Pain Management Visit Note  Patient: Joanne Roberts, 37 y.o., female  "Hello [facility] am a [facility] the [facility]. We have an anesthesia team available at all times to provide care throughout the hospital, including epidural management and anesthesia for C-section. I don't know your plan for the delivery whether it a natural birth, water birth, IV sedation, nitrous supplementation, doula or epidural, but we want to meet your pain goals."   1.Was your pain managed to your expectations on prior hospitalizations?   Yes   2.What is your expectation for pain management during this hospitalization?     Natural  3.How can we help you reach that goal? Natural  Record the patient's initial score and the patient's pain goal.   Pain: 0  Pain Goal: 10 [facility] wants you to be able to say your pain was always managed very well.  Rica RecordsICKELTON,Chester Sibert 01/16/2017

## 2017-01-16 NOTE — Progress Notes (Signed)
Patient is admitted and ready for induction.  Provider, Dr. Darin EngelsAbraham, requested to do the cervical check, I have called provider and let her know patient is ready for exam.

## 2017-01-16 NOTE — Progress Notes (Signed)
Katheran Awebigail Popescu is a 37 y.o. [id number] at 6372w0d by ultrasound admitted for induction of labor due to Gestational diabetes.  Subjective: Patient doing well. No concerns. Would like to eat. States she feels contractions all on her rights side.   Objective: BP (!) 118/58   Pulse (!) 55   Temp 98.1 F (36.7 C) (Oral)   Resp 18   Ht 5\' 4"  (1.626 m)   Wt 98.9 kg (218 lb)   LMP 03/14/2016 (Exact Date)   BMI 37.42 kg/m  No intake/output data recorded. No intake/output data recorded.  FHT:  FHR: 130 bpm, variability: moderate,  accelerations:  Abscent,  decelerations:  Absent UC:   irregular, every 2-5 minutes SVE:   Dilation: 2 Effacement (%): Thick Station: -3 Exam by:: Lajuana Matteina Jacobs, RN  Labs: Lab Results  Component Value Date   WBC 7.7 01/16/2017   HGB 9.6 (L) 01/16/2017   HCT 30.8 (L) 01/16/2017   MCV 76.0 (L) 01/16/2017   PLT 199 01/16/2017    Assessment / Plan: Augmentation of labor, progressing well  Labor: Progressing normally Fetal Wellbeing:  Category I Pain Control:  Labor support without medications I/D:  n/a Anticipated MOD:  NSVD  Doriana Mazurkiewicz Darin Engelsbraham 01/16/2017, 4:54 PM

## 2017-01-16 NOTE — Progress Notes (Signed)
Patient ID: Katheran Awebigail Steppe, female   DOB: 03/01/1980, 37 y.o.   MRN: [id number]  Labor Progress Note Katheran Awebigail Boney is a 37 y.o. [id number] at 5576w0d presented for IOL for A2GDM.  S: Mildly uncomfortable, started IV fentanyl.  O:  BP (!) 115/49   Pulse 62   Temp 98.1 F (36.7 C) (Oral)   Resp 18   Ht 5\' 4"  (1.626 m)   Wt 98.9 kg (218 lb)   LMP 03/14/2016 (Exact Date)   BMI 37.42 kg/m  EFM: 120/modertate variability/+ acc/- dec  CVE: Dilation: 4 Effacement (%): 60 Cervical Position: Posterior Station: -3, Ballotable Presentation: Vertex Exam by:: Dr. Nira Retortegele    A&P: 37 y.o. [id number] 3476w0d IOL for A2GDM  #Labor: Progressing well. Started pitocin. #Pain: IV fentanyl #FWB: category 1 #GBS negative  Larose Hireshristopher Jesse Hirst, Medical Student 8:36 PM

## 2017-01-16 NOTE — Progress Notes (Signed)
Walked in patient's room and she had an insulin syringe sitting on her side table.  She stated that she had given herself 25 units of insulin per "what the doctor yesterday told me to do". I explained that during her stay the staff will be checking her blood sugar and administering insulin as needed.  Patient and father of the baby, who was at bedside, both verbally stated understanding and had no further questions regarding patient's blood sugars.

## 2017-01-16 NOTE — Progress Notes (Signed)
SVE 4/60/ballotable  Will start IV Pitocin

## 2017-01-17 ENCOUNTER — Encounter (HOSPITAL_COMMUNITY): Payer: Self-pay

## 2017-01-17 DIAGNOSIS — O24424 Gestational diabetes mellitus in childbirth, insulin controlled: Secondary | ICD-10-CM

## 2017-01-17 DIAGNOSIS — Z3A39 39 weeks gestation of pregnancy: Secondary | ICD-10-CM

## 2017-01-17 LAB — CBC
HEMATOCRIT: 30.9 % — AB (ref 36.0–46.0)
HEMOGLOBIN: 9.6 g/dL — AB (ref 12.0–15.0)
MCH: 23.6 pg — ABNORMAL LOW (ref 26.0–34.0)
MCHC: 31.1 g/dL (ref 30.0–36.0)
MCV: 75.9 fL — AB (ref 78.0–100.0)
Platelets: 173 10*3/uL (ref 150–400)
RBC: 4.07 MIL/uL (ref 3.87–5.11)
RDW: 18.7 % — ABNORMAL HIGH (ref 11.5–15.5)
WBC: 11.1 10*3/uL — AB (ref 4.0–10.5)

## 2017-01-17 LAB — RPR: RPR: NONREACTIVE

## 2017-01-17 LAB — GLUCOSE, CAPILLARY: GLUCOSE-CAPILLARY: 77 mg/dL (ref 65–99)

## 2017-01-17 MED ORDER — ZOLPIDEM TARTRATE 5 MG PO TABS: 5.0000 mg | ORAL_TABLET | Freq: Every evening | ORAL | Status: DC | PRN

## 2017-01-17 MED ORDER — COCONUT OIL OIL: 1.0000 "application " | TOPICAL_OIL | Status: DC | PRN

## 2017-01-17 MED ORDER — WITCH HAZEL-GLYCERIN EX PADS: 1.0000 "application " | MEDICATED_PAD | CUTANEOUS | Status: DC | PRN

## 2017-01-17 MED ORDER — SIMETHICONE 80 MG PO CHEW: 80.0000 mg | CHEWABLE_TABLET | ORAL | Status: DC | PRN

## 2017-01-17 MED ORDER — SENNOSIDES-DOCUSATE SODIUM 8.6-50 MG PO TABS
2.0000 | ORAL_TABLET | ORAL | Status: DC
  Administered 2017-01-18: 2 via ORAL
  Filled 2017-01-17: qty 2

## 2017-01-17 MED ORDER — ACETAMINOPHEN 325 MG PO TABS
650.0000 mg | ORAL_TABLET | ORAL | Status: DC | PRN
  Administered 2017-01-18: 650 mg via ORAL
  Filled 2017-01-17: qty 2

## 2017-01-17 MED ORDER — EPHEDRINE 5 MG/ML INJ: 10.0000 mg | INTRAVENOUS | Status: DC | PRN

## 2017-01-17 MED ORDER — LACTATED RINGERS IV SOLN: 500.0000 mL | Freq: Once | INTRAVENOUS | Status: DC

## 2017-01-17 MED ORDER — ONDANSETRON HCL 4 MG PO TABS: 4.0000 mg | ORAL_TABLET | ORAL | Status: DC | PRN

## 2017-01-17 MED ORDER — BENZOCAINE-MENTHOL 20-0.5 % EX AERO
1.0000 "application " | INHALATION_SPRAY | CUTANEOUS | Status: DC | PRN
  Administered 2017-01-17: 1 via TOPICAL
  Filled 2017-01-17: qty 56

## 2017-01-17 MED ORDER — OXYCODONE HCL 5 MG PO TABS: 10.0000 mg | ORAL_TABLET | ORAL | Status: DC | PRN

## 2017-01-17 MED ORDER — DIPHENHYDRAMINE HCL 50 MG/ML IJ SOLN: 12.5000 mg | INTRAMUSCULAR | Status: DC | PRN

## 2017-01-17 MED ORDER — PRENATAL MULTIVITAMIN CH
1.0000 | ORAL_TABLET | Freq: Every day | ORAL | Status: DC
  Administered 2017-01-17 – 2017-01-18 (×2): 1 via ORAL
  Filled 2017-01-17 (×2): qty 1

## 2017-01-17 MED ORDER — METHYLERGONOVINE MALEATE 0.2 MG/ML IJ SOLN
INTRAMUSCULAR | Status: AC
  Filled 2017-01-17: qty 1

## 2017-01-17 MED ORDER — DIBUCAINE 1 % RE OINT: 1.0000 "application " | TOPICAL_OINTMENT | RECTAL | Status: DC | PRN

## 2017-01-17 MED ORDER — TETANUS-DIPHTH-ACELL PERTUSSIS 5-2.5-18.5 LF-MCG/0.5 IM SUSP: 0.5000 mL | Freq: Once | INTRAMUSCULAR | Status: DC

## 2017-01-17 MED ORDER — IBUPROFEN 600 MG PO TABS
600.0000 mg | ORAL_TABLET | Freq: Four times a day (QID) | ORAL | Status: DC
  Administered 2017-01-17 – 2017-01-18 (×6): 600 mg via ORAL
  Filled 2017-01-17 (×6): qty 1

## 2017-01-17 MED ORDER — OXYCODONE HCL 5 MG PO TABS: 5.0000 mg | ORAL_TABLET | ORAL | Status: DC | PRN

## 2017-01-17 MED ORDER — MISOPROSTOL 200 MCG PO TABS
ORAL_TABLET | ORAL | Status: AC
  Filled 2017-01-17: qty 4

## 2017-01-17 MED ORDER — DIPHENHYDRAMINE HCL 25 MG PO CAPS: 25.0000 mg | ORAL_CAPSULE | Freq: Four times a day (QID) | ORAL | Status: DC | PRN

## 2017-01-17 MED ORDER — PHENYLEPHRINE 40 MCG/ML (10ML) SYRINGE FOR IV PUSH (FOR BLOOD PRESSURE SUPPORT): 80.0000 ug | PREFILLED_SYRINGE | INTRAVENOUS | Status: DC | PRN

## 2017-01-17 MED ORDER — LIDOCAINE HCL (PF) 1 % IJ SOLN
INTRAMUSCULAR | Status: DC | PRN
  Administered 2017-01-16 (×2): 5 mL via EPIDURAL

## 2017-01-17 MED ORDER — MISOPROSTOL 200 MCG PO TABS
800.0000 ug | ORAL_TABLET | Freq: Once | ORAL | Status: AC
  Administered 2017-01-17: 800 ug via ORAL

## 2017-01-17 MED ORDER — ONDANSETRON HCL 4 MG/2ML IJ SOLN: 4.0000 mg | INTRAMUSCULAR | Status: DC | PRN

## 2017-01-17 NOTE — Anesthesia Postprocedure Evaluation (Signed)
Anesthesia Post Note  Patient: Joanne Roberts  Procedure(s) Performed: AN AD HOC LABOR EPIDURAL     Patient location during evaluation: Mother Baby Anesthesia Type: Epidural Level of consciousness: awake and alert Pain management: pain level controlled Vital Signs Assessment: post-procedure vital signs reviewed and stable Respiratory status: spontaneous breathing Cardiovascular status: stable Postop Assessment: no headache, patient able to bend at knees, no backache, no apparent nausea or vomiting, epidural receding and adequate PO intake Anesthetic complications: no    Last Vitals:  Vitals:   01/17/17 0330 01/17/17 0425  BP: (!) 134/59 (!) 109/46  Pulse: (!) 57 (!) 57  Resp: 18 18  Temp: (!) 38 C 37.3 C  SpO2: 99%     Last Pain:  Vitals:   01/17/17 0533  TempSrc:   PainSc: 0-No pain   Pain Goal: Patients Stated Pain Goal: 10 (01/16/17 1306)               Jennye MoccasinSterling, Weldon Pickingharlesetta Marie

## 2017-01-17 NOTE — Anesthesia Preprocedure Evaluation (Signed)
Anesthesia Evaluation  Patient identified by MRN, date of birth, ID band Patient awake    Reviewed: Allergy & Precautions, H&P , NPO status , Patient's Chart, lab work & pertinent test results, reviewed documented beta blocker date and time   Airway Mallampati: II  TM Distance: >3 FB Neck ROM: full    Dental no notable dental hx.    Pulmonary neg pulmonary ROS,    Pulmonary exam normal breath sounds clear to auscultation       Cardiovascular negative cardio ROS Normal cardiovascular exam Rhythm:regular Rate:Normal     Neuro/Psych negative neurological ROS  negative psych ROS   GI/Hepatic negative GI ROS, Neg liver ROS,   Endo/Other  negative endocrine ROSdiabetes  Renal/GU negative Renal ROS  negative genitourinary   Musculoskeletal   Abdominal   Peds  Hematology negative hematology ROS (+) anemia ,   Anesthesia Other Findings   Reproductive/Obstetrics (+) Pregnancy                             Anesthesia Physical Anesthesia Plan  ASA: III  Anesthesia Plan: Epidural   Post-op Pain Management:    Induction:   PONV Risk Score and Plan:   Airway Management Planned:   Additional Equipment:   Intra-op Plan:   Post-operative Plan:   Informed Consent: I have reviewed the patients History and Physical, chart, labs and discussed the procedure including the risks, benefits and alternatives for the proposed anesthesia with the patient or authorized representative who has indicated his/her understanding and acceptance.     Plan Discussed with:   Anesthesia Plan Comments:         Anesthesia Quick Evaluation

## 2017-01-17 NOTE — Lactation Note (Signed)
This note was copied from a baby's chart. Lactation Consultation Note  Patient Name: Joanne Roberts ZOXWR'UToday's Date: Katheran Awe1/06/2017 Reason for consult: Initial assessment  Initial visit at 14 hours of age.  Mom reports difficulty with older children nursing for about 3 months with nipple pain.  Mom denies pain with latch and feels this baby is doing well.  Baby is STS after bath and reports recent feeding.  [facility] given and discussed.  Encouraged to feed with early cues on demand.  Early newborn behavior discussed.  Hand expression demonstrated with colostrum visible.  Mom to call for assist as needed.     Maternal Data Has patient been taught Hand Expression?: Yes Does the patient have breastfeeding experience prior to this delivery?: Yes  Feeding Feeding Type: Breast Fed Length of feed: 20 min  LATCH Score                   Interventions Interventions: Breast feeding basics reviewed  Lactation Tools Discussed/Used WIC Program: Yes   Consult Status Consult Status: Follow-up    Franz DellJana Myonna Roberts 01/17/2017, 3:49 PM

## 2017-01-17 NOTE — Anesthesia Procedure Notes (Signed)
Epidural Patient location during procedure: OB Start time: 01/16/2017 11:14 PM End time: 01/16/2017 11:20 PM  Staffing Anesthesiologist: Bethena Midgetddono, Teona Vargus, MD  Preanesthetic Checklist Completed: patient identified, site marked, surgical consent, pre-op evaluation, timeout performed, IV checked, risks and benefits discussed and monitors and equipment checked  Epidural Prep: site prepped and draped and DuraPrep Patient monitoring: continuous pulse ox and blood pressure Approach: midline Location: L3-L4 Injection technique: LOR air  Needle:  Needle type: Tuohy  Needle gauge: 17 G Needle length: 9 cm and 9 Needle insertion depth: 6 cm Catheter type: closed end flexible Catheter size: 19 Gauge Catheter at skin depth: 11 cm Test dose: negative  Assessment Events: blood not aspirated, injection not painful, no injection resistance, negative IV test and no paresthesia

## 2017-01-18 LAB — CERVICOVAGINAL ANCILLARY ONLY
BACTERIAL VAGINITIS: NEGATIVE
CANDIDA VAGINITIS: NEGATIVE

## 2017-01-18 MED ORDER — IBUPROFEN 600 MG PO TABS
600.0000 mg | ORAL_TABLET | Freq: Four times a day (QID) | ORAL | 0 refills | Status: DC
Start: 2017-01-18 — End: 2019-08-18

## 2017-01-18 NOTE — Discharge Summary (Signed)
OB Discharge Summary     Patient Name: Joanne Roberts DOB: Oct 15, 1980 MRN: [id number]  Date of admission: 01/16/2017 Delivering MD: Wende Mott   Date of discharge: 01/18/2017  Admitting diagnosis: 39 wk induction Intrauterine pregnancy: [redacted]w[redacted]d    Secondary diagnosis:  Active Problems:   Gestational diabetes  Additional problems: Patient Active Problem List   Diagnosis Date Noted  . Gestational diabetes 01/16/2017  . Decreased fetal movements in third trimester 11/19/2016  . Obesity (BMI 30-39.9) 10/13/2016  . Obesity in pregnancy 10/13/2016  . Gestational diabetes mellitus (GDM), antepartum 10/08/2016  . Advanced maternal age in multigravida 09/29/2016  . Anemia complicating pregnancy 057/84/6962 . Supervision of high risk pregnancy, antepartum, second trimester 04/08/2011       Discharge diagnosis: Term Pregnancy Delivered                                                                                                Post partum procedures:None  Augmentation: Pitocin and Cytotec  Complications: None  Hospital course:  Induction of Labor With Vaginal Delivery   37y.o. yo [id number] 36w1das admitted to the hospital 01/16/2017 for induction of labor.  Indication for induction: A2 DM.  Patient had an uncomplicated labor course as follows: Membrane Rupture Time/Date: 3:46 PM ,01/16/2017   Intrapartum Procedures: Episiotomy: None [1]                                         Lacerations:  2nd degree [3]  Patient had delivery of a Viable infant.  Information for the patient's newborn:  RoMaelie, Chriswell0[[id number]]Delivery Method: Vag-Spont   01/17/2017  Details of delivery can be found in separate delivery note.  Patient had a routine postpartum course. Patient is discharged home 01/18/17.  Physical exam  Vitals:   01/17/17 0425 01/17/17 0930 01/17/17 1616 01/18/17 0528  BP: (!) 109/46 (!) 98/53 (!) 98/40 (!) 100/49  Pulse: (!) 57 (!) 48 (!) 49 61  Resp:  _0 Temp: 99.2 F (37.3 C) 98.8 F (37.1 C) 98.5 F (36.9 C) 98.7 F (37.1 C)  TempSrc: Oral Oral Oral Oral  SpO2:  98% 99%   Weight:      Height:       General: alert, cooperative and no distress Lochia: appropriate Uterine Fundus: firm Incision: N/A DVT Evaluation: No evidence of DVT seen on physical exam. Labs: Lab Results  Component Value Date   WBC 11.1 (H) 01/17/2017   HGB 9.6 (L) 01/17/2017   HCT 30.9 (L) 01/17/2017   MCV 75.9 (L) 01/17/2017   PLT 173 01/17/2017   CMP Latest Ref Rng & Units 12/22/2016  Glucose 65 - 99 mg/dL 87  BUN 6 - 20 mg/dL 8  Creatinine 0.57 - 1.00 mg/dL 0.51(L)  Sodium 134 - 144 mmol/L 137  Potassium 3.5 - 5.2 mmol/L 3.9  Chloride 96 - 106 mmol/L 104  CO2 20 - 29 mmol/L 21  Calcium 8.7 - 10.2  mg/dL 8.6(L)  Total Protein 6.0 - 8.5 g/dL 6.9  Total Bilirubin 0.0 - 1.2 mg/dL 0.3  Alkaline Phos 39 - 117 IU/L 211(H)  AST 0 - 40 IU/L 15  ALT 0 - 32 IU/L 10    Discharge instruction: per After Visit Summary and "Baby and Me Booklet".  After visit meds:  Allergies as of 01/18/2017   No Known Allergies     Medication List    STOP taking these medications   ACCU-CHEK FASTCLIX LANCETS Misc   insulin NPH Human 100 UNIT/ML injection Commonly known as:  HUMULIN N,NOVOLIN N   insulin regular 100 units/mL injection Commonly known as:  NOVOLIN R,HUMULIN R   INSULIN SYRINGE 1CC/31GX5/16" 31G X 5/16" 1 ML Misc     TAKE these medications   ACCU-CHEK NANO SMARTVIEW w/Device Kit 1 kit by Subdermal route as directed. Check blood sugars for fasting, and two hours after breakfast, lunch and dinner (4 checks daily)   docusate sodium 100 MG capsule Commonly known as:  COLACE Take 1 capsule (100 mg total) by mouth 2 (two) times daily as needed.   ferrous gluconate 324 MG tablet Commonly known as:  FERGON Take 1 tablet (324 mg total) by mouth daily with breakfast.   glucose blood test strip Commonly known as:  ACCU-CHEK SMARTVIEW Use  as instructed to check blood sugars   ibuprofen 600 MG tablet Commonly known as:  ADVIL,MOTRIN Take 1 tablet (600 mg total) by mouth every 6 (six) hours.   prenatal multivitamin Tabs tablet Take 1 tablet by mouth every evening.       Diet: routine diet  Activity: Advance as tolerated. Pelvic rest for 6 weeks.   Outpatient follow up:4 weeks Follow up Appt: Future Appointments  Date Time Provider [place]  02/19/2017  9:15 AM Rasch, Artist Pais, NP [facility]   Follow up Visit:No Follow-up on file.  Postpartum contraception: Vasectomy  Newborn Data: Live born female  Birth Weight: 7 lb 11.3 oz (3496 g) APGAR: 9, 9  Newborn Delivery   Birth date/time:  01/17/2017 01:06:00 Delivery type:  Vaginal, Spontaneous     Baby Feeding: Breast Disposition:home with mother   01/18/2017 Luiz Blare, DO

## 2017-01-18 NOTE — Lactation Note (Signed)
This note was copied from a baby's chart. Lactation Consultation Note  Patient Name: Joanne Roberts Due UXLKG'MToday's Date: 01/18/2017 Reason for consult: Follow-up assessment;Infant weight loss   Follow up with Exp BF mom of 34 hour old infant. Infant with 9 BF for 10-30 minutes, 3 voids, 4 stools, and 1 emesis in the last 24 hours. Infant weight 7 lb 1.9 oz with 8% weight loss since birth.   Mom reports infant is feeding well and she is not experiencing nipple pain with feeding. Enc mom to make sure infant is fed STS 8-12 x in 24 hours at first feeding cues. Enc mom to keep infant awake at the breast and to massage/compress breast with feedings. Infant with follow up Ped appt tomorrow morning.   BF Basics, I/O. Signs of dehydration in the infant, engorgement prevention/treatment , and breast milk expression and storage reviewed. Mom was given a manual pump with instructions for use and cleaning. [facility], mom aware of OP services, BF Support Groups and LC phone #.   [facility] and has not seen WIC in the hospital, mom aware to call if does not see WIC before leaving today.   Mom reports she has no questions/concerns at this time. [facility].    Maternal Data Formula Feeding for Exclusion: No Has patient been taught Hand Expression?: Yes Does the patient have breastfeeding experience prior to this delivery?: Yes  Feeding    LATCH Score                   Interventions Interventions: Breast feeding basics reviewed;Support pillows;Skin to skin;Breast massage;Breast compression;Hand pump;Hand express  Lactation Tools Discussed/Used WIC Program: Yes Pump Review: Setup, frequency, and cleaning;Milk Storage   Consult Status Consult Status: Complete Follow-up type: Call as needed    Ed BlalockSharon S Lazaria Schaben 01/18/2017, 11:20 AM

## 2017-01-18 NOTE — Discharge Instructions (Signed)

## 2017-01-19 ENCOUNTER — Encounter: Payer: Self-pay | Admitting: *Deleted

## 2017-02-18 ENCOUNTER — Encounter: Payer: Self-pay | Admitting: Student

## 2017-02-18 ENCOUNTER — Other Ambulatory Visit: Payer: Self-pay

## 2017-02-18 ENCOUNTER — Ambulatory Visit (INDEPENDENT_AMBULATORY_CARE_PROVIDER_SITE_OTHER): Payer: Self-pay | Admitting: Student

## 2017-02-18 VITALS — BP 116/59 | HR 57

## 2017-02-18 DIAGNOSIS — Z8632 Personal history of gestational diabetes: Secondary | ICD-10-CM

## 2017-02-18 DIAGNOSIS — O24414 Gestational diabetes mellitus in pregnancy, insulin controlled: Secondary | ICD-10-CM

## 2017-02-18 NOTE — Patient Instructions (Signed)
 Lactancia materna Breastfeeding Decidir amamantar es una de las mejores elecciones que puede hacer por usted y su beb. Un cambio en las hormonas durante el embarazo hace que las mamas produzcan leche materna en las glndulas productoras de leche. Las hormonas impiden que la leche materna sea liberada antes del nacimiento del beb. Adems, impulsan el flujo de leche luego del nacimiento. Una vez que ha comenzado a amamantar, pensar en el beb, as como la succin o el llanto, pueden estimular la liberacin de leche de las glndulas productoras de leche. Los beneficios de amamantar Las investigaciones demuestran que la lactancia materna ofrece muchos beneficios de salud para bebs y madres. Adems, ofrece una forma gratuita y conveniente de alimentar al beb. Para el beb  La primera leche (calostro) ayuda a mejorar el funcionamiento del aparato digestivo del beb.  Las clulas especiales de la leche (anticuerpos) ayudan a combatir las infecciones en el beb.  Los bebs que se alimentan con leche materna tambin tienen menos probabilidades de tener asma, alergias, obesidad o diabetes de tipo 2. Adems, tienen menor riesgo de sufrir el sndrome de muerte sbita del lactante (SMSL).  Los nutrientes de la leche materna son mejores para satisfacer las necesidades del beb en comparacin con la leche maternizada.  La leche materna mejora el desarrollo cerebral del beb. Para usted  La lactancia materna favorece el desarrollo de un vnculo muy especial entre la madre y el beb.  Es conveniente. La leche materna es econmica y siempre est disponible a la temperatura correcta.  La lactancia materna ayuda a quemar caloras. Le ayuda a perder el peso ganado durante el embarazo.  Hace que el tero vuelva al tamao que tena antes del embarazo ms rpido. Adems, disminuye el sangrado (loquios) despus del parto.  La lactancia materna contribuye a reducir el riesgo de tener diabetes de tipo 2,  osteoporosis, artritis reumatoide, enfermedades cardiovasculares y cncer de mama, ovario, tero y endometrio en el futuro. Informacin bsica sobre la lactancia Comienzo de la lactancia  Encuentre un lugar cmodo para sentarse o acostarse, con un buen respaldo para el cuello y la espalda.  Coloque una almohada o una manta enrollada debajo del beb para acomodarlo a la altura de la mama (si est sentada). Las almohadas para amamantar se han diseado especialmente a fin de servir de apoyo para los brazos y el beb mientras amamanta.  Asegrese de que la barriga del beb (abdomen) est frente a la suya.  Masajee suavemente la mama. Con las yemas de los dedos, masajee los bordes exteriores de la mama hacia adentro, en direccin al pezn. Esto estimula el flujo de leche. Si la leche fluye lentamente, es posible que deba continuar con este movimiento durante la lactancia.  Sostenga la mama con 4 dedos por debajo y el pulgar por arriba del pezn (forme la letra "C" con la mano). Asegrese de que los dedos se encuentren lejos del pezn y de la boca del beb.  Empuje suavemente los labios del beb con el pezn o con el dedo.  Cuando la boca del beb se abra lo suficiente, acrquelo rpidamente a la mama e introduzca todo el pezn y la arola, tanto como sea posible, dentro de la boca del beb. La arola es la zona de color que rodea al pezn. ? Debe haber ms arola visible por arriba del labio superior del beb que por debajo del labio inferior. ? Los labios del beb deben estar abiertos y extendidos hacia afuera (evertidos) para asegurar que   el beb se prenda de forma adecuada y cmoda. ? La lengua del beb debe estar entre la enca inferior y la mama.  Asegrese de que la boca del beb est en la posicin correcta alrededor del pezn (prendido). Los labios del beb deben crear un sello sobre la mama y estar doblados hacia afuera (invertidos).  Es comn que el beb succione durante 2 a 3 minutos  para que comience el flujo de leche materna. Cmo debe prenderse Es muy importante que le ensee al beb cmo prenderse adecuadamente a la mama. Si el beb no se prende adecuadamente, puede causar dolor en los pezones, reducir la produccin de leche materna y hacer que el beb tenga un escaso aumento de peso. Adems, si el beb no se prende adecuadamente al pezn, puede tragar aire durante la alimentacin. Esto puede causarle molestias al beb. Hacer eructar al beb al cambiar de mama puede ayudarlo a liberar el aire. Sin embargo, ensearle al beb cmo prenderse a la mama adecuadamente es la mejor manera de evitar que se sienta molesto por tragar aire mientras se alimenta. Signos de que el beb se ha prendido adecuadamente al pezn  Tironea o succiona de modo silencioso, sin causarle dolor. Los labios del beb deben estar extendidos hacia afuera (evertidos).  Se escucha que traga cada 3 o 4 succiones una vez que la leche ha comenzado a fluir (despus de que se produzca el reflejo de eyeccin de la leche).  Hay movimientos musculares por arriba y por delante de sus odos al succionar.  Signos de que el beb no se ha prendido adecuadamente al pezn  Hace ruidos de succin o de chasquido mientras se alimenta.  Siente dolor en los pezones.  Si cree que el beb no se prendi correctamente, deslice el dedo en la comisura de la boca y colquelo entre las encas del beb para interrumpir la succin. Intente volver a comenzar a amamantar. Signos de lactancia materna exitosa Signos del beb  El beb disminuir gradualmente el nmero de succiones o dejar de succionar por completo.  El beb se quedar dormido.  El cuerpo del beb se relajar.  El beb retendr una pequea cantidad de leche en la boca.  El beb se desprender solo del pecho.  Signos que presenta usted  Las mamas han aumentado la firmeza, el peso y el tamao 1 a 3 horas despus de amamantar.  Estn ms blandas inmediatamente  despus de amamantar.  Se producen un aumento del volumen de leche y un cambio en su consistencia y color hacia el quinto da de lactancia.  Los pezones no duelen, no estn agrietados ni sangran.  Signos de que su beb recibe la cantidad de leche suficiente  Mojar por lo menos 1 o 2paales durante las primeras 24horas despus del nacimiento.  Mojar por lo menos 5 o 6paales cada 24horas durante la primera semana despus del nacimiento. La orina debe ser clara o de color amarillo plido a los 5das de vida.  Mojar entre 6 y 8paales cada 24horas a medida que el beb sigue creciendo y desarrollndose.  Defeca por lo menos 3 veces en 24 horas a los 5 das de vida. Las heces deben ser blandas y amarillentas.  Defeca por lo menos 3 veces en 24 horas a los 7 das de vida. Las heces deben ser grumosas y amarillentas.  No registra una prdida de peso mayor al 10% del peso al nacer durante los primeros 3 das de vida.  Aumenta de peso un   promedio de 4 a 7onzas (113 a 198g) por semana despus de los 4 das de vida.  Aumenta de peso, diariamente, de manera uniforme a partir de los 5 das de vida, sin registrar prdida de peso despus de las 2semanas de vida. Despus de alimentarse, es posible que el beb regurgite una pequea cantidad de leche. Esto es normal. Frecuencia y duracin de la lactancia El amamantamiento frecuente la ayudar a producir ms leche y puede prevenir dolores en los pezones y las mamas extremadamente llenas (congestin mamaria). Alimente al beb cuando muestre signos de hambre o si siente la necesidad de reducir la congestin de las mamas. Esto se denomina "lactancia a demanda". Las seales de que el beb tiene hambre incluyen las siguientes:  Aumento del estado de alerta, actividad o inquietud.  Mueve la cabeza de un lado a otro.  Abre la boca cuando se le toca la mejilla o la comisura de la boca (reflejo de bsqueda).  Aumenta las vocalizaciones, tales como  sonidos de succin, se relame los labios, emite arrullos, suspiros o chirridos.  Mueve la mano hacia la boca y se chupa los dedos o las manos.  Est molesto o llora.  Evite el uso del chupete en las primeras 4 a 6 semanas despus del nacimiento del beb. Despus de este perodo, podr usar un chupete. Las investigaciones demostraron que el uso del chupete durante el primer ao de vida del beb disminuye el riesgo de tener el sndrome de muerte sbita del lactante (SMSL). Permita que el nio se alimente en cada mama todo lo que desee. Cuando el beb se desprende o se queda dormido mientras se est alimentando de la primera mama, ofrzcale la segunda. Debido a que, con frecuencia, los recin nacidos estn somnolientos las primeras semanas de vida, es posible que deba despertar al beb para alimentarlo. Los horarios de lactancia varan de un beb a otro. Sin embargo, las siguientes reglas pueden servir como gua para ayudarla a garantizar que el beb se alimenta adecuadamente:  Se puede amamantar a los recin nacidos (bebs de 4 semanas o menos de vida) cada 1 a 3 horas.  No deben transcurrir ms de 3 horas durante el da o 5 horas durante la noche sin que se amamante a los recin nacidos.  Debe amamantar al beb un mnimo de 8 veces en un perodo de 24 horas.  Extraccin de leche materna La extraccin y el almacenamiento de la leche materna le permiten asegurarse de que el beb se alimente exclusivamente de su leche materna, aun en momentos en los que no puede amamantar. Esto tiene especial importancia si debe regresar al trabajo en el perodo en que an est amamantando o si no puede estar presente en los momentos en que el beb debe alimentarse. Su asesor en lactancia puede ayudarla a encontrar un mtodo de extraccin que funcione mejor para usted y orientarla sobre cunto tiempo es seguro almacenar leche materna. Cmo cuidar las mamas durante la lactancia Los pezones pueden secarse, agrietarse y  doler durante la lactancia. Las siguientes recomendaciones pueden ayudarla a mantener las mamas humectadas y sanas:  Evite usar jabn en los pezones.  Use un sostn de soporte diseado especialmente para la lactancia materna. Evite usar sostenes con aro o sostenes muy ajustados (sostenes deportivos).  Seque al aire sus pezones durante 3 a 4minutos despus de amamantar al beb.  Utilice solo apsitos de algodn en el sostn para absorber las prdidas de leche. La prdida de un poco de leche materna   entre las tomas es normal.  Utilice lanolina sobre los pezones luego de amamantar. La lanolina ayuda a mantener la humedad normal de la piel. La lanolina pura no es perjudicial (no es txica) para el beb. Adems, puede extraer manualmente algunas gotas de leche materna y masajear suavemente esa leche sobre los pezones para que la leche se seque al aire.  Durante las primeras semanas despus del nacimiento, algunas mujeres experimentan congestin mamaria. La congestin mamaria puede hacer que sienta las mamas pesadas, calientes y sensibles al tacto. El pico de la congestin mamaria ocurre en el plazo de los 3 a 5 das despus del parto. Las siguientes recomendaciones pueden ayudarla a aliviar la congestin mamaria:  Vace por completo las mamas al amamantar o extraer leche. Puede aplicar calor hmedo en las mamas (en la ducha o con toallas hmedas para manos) antes de amamantar o extraer leche. Esto aumenta la circulacin y ayuda a que la leche fluya. Si el beb no vaca por completo las mamas cuando lo amamanta, extraiga la leche restante despus de que haya finalizado.  Aplique compresas de hielo sobre las mamas inmediatamente despus de amamantar o extraer leche, a menos que le resulte demasiado incmodo. Haga lo siguiente: ? Ponga el hielo en una bolsa plstica. ? Coloque una toalla entre la piel y la bolsa de hielo. ? Coloque el hielo durante 20minutos, 2 o 3veces por da.  Asegrese de que el  beb est prendido y se encuentre en la posicin correcta mientras lo alimenta.  Si la congestin mamaria persiste luego de 48 horas o despus de seguir estas recomendaciones, comunquese con su mdico o un asesor en lactancia. Recomendaciones de salud general durante la lactancia  Consuma 3 comidas y 3 colaciones saludables todos los das. Las madres bien alimentadas que amamantan necesitan entre 450 y 500 caloras adicionales por da. Puede cumplir con este requisito al aumentar la cantidad de una dieta equilibrada que realice.  Beba suficiente agua para mantener la orina clara o de color amarillo plido.  Descanse con frecuencia, reljese y siga tomando sus vitaminas prenatales para prevenir la fatiga, el estrs y los niveles bajos de vitaminas y minerales en el cuerpo (deficiencias de nutrientes).  No consuma ningn producto que contenga nicotina o tabaco, como cigarrillos y cigarrillos electrnicos. El beb puede verse afectado por las sustancias qumicas de los cigarrillos que pasan a la leche materna y por la exposicin al humo ambiental del tabaco. Si necesita ayuda para dejar de fumar, consulte al mdico.  Evite el consumo de alcohol.  No consuma drogas ilegales o marihuana.  Antes de usar cualquier medicamento, hable con el mdico. Estos incluyen medicamentos recetados y de venta libre, como tambin vitaminas y suplementos a base de hierbas. Algunos medicamentos, que pueden ser perjudiciales para el beb, pueden pasar a travs de la leche materna.  Puede quedar embarazada durante la lactancia. Si se desea un mtodo anticonceptivo, consulte al mdico sobre cules son las opciones seguras durante la lactancia. Dnde encontrar ms informacin: Liga internacional La Leche: www.llli.org. Comunquese con un mdico si:  Siente que quiere dejar de amamantar o se siente frustrada con la lactancia.  Sus pezones estn agrietados o sangran.  Sus mamas estn irritadas, sensibles o  calientes.  Tiene los siguientes sntomas: ? Dolor en las mamas o en los pezones. ? Un rea hinchada en cualquiera de las mamas. ? Fiebre o escalofros. ? Nuseas o vmitos. ? Drenaje de otro lquido distinto de la leche materna desde los pezones.    Sus mamas no se llenan antes de amamantar al beb para el quinto da despus del parto.  Se siente triste y deprimida.  El beb: ? Est demasiado somnoliento como para comer bien. ? Tiene problemas para dormir. ? Tiene ms de 1 semana de vida y moja menos de 6 paales en un periodo de 24 horas. ? No ha aumentado de peso a los 5 das de vida.  El beb defeca menos de 3 veces en 24 horas.  La piel del beb o las partes blancas de los ojos se vuelven amarillentas. Solicite ayuda de inmediato si:  El beb est muy cansado (letargo) y no se quiere despertar para comer.  Le sube la fiebre sin causa. Resumen  La lactancia materna ofrece muchos beneficios de salud para bebs y madres.  Intente amamantar a su beb cuando muestre signos tempranos de hambre.  Haga cosquillas o empuje suavemente los labios del beb con el dedo o el pezn para lograr que el beb abra la boca. Acerque el beb a la mama. Asegrese de que la mayor parte de la arola se encuentre dentro de la boca del beb. Ofrzcale una mama y haga eructar al beb antes de pasar a la otra.  Hable con su mdico o asesor en lactancia si tiene dudas o problemas con la lactancia. Esta informacin no tiene como fin reemplazar el consejo del mdico. Asegrese de hacerle al mdico cualquier pregunta que tenga. Document Released: 12/29/2004 Document Revised: 04/20/2016 Document Reviewed: 04/20/2016 Elsevier Interactive Patient Education  2018 Elsevier Inc.  

## 2017-02-18 NOTE — Progress Notes (Signed)
Subjective:     Joanne Roberts is a 37 y.o. female who presents for a postpartum visit. She is 4 weeks postpartum following a spontaneous vaginal delivery. I have fully reviewed the prenatal and intrapartum course. The delivery was at 39 gestational weeks. Outcome: spontaneous vaginal delivery. Anesthesia: epidural. Postpartum course has been uneventful. Baby's course has been uneventful. Baby is feeding by breast. Bleeding brown. Bowel function is normal. Bladder function is normal. Patient is not sexually active. Contraception method is none. Postpartum depression screening: negative.  The following portions of the patient's history were reviewed and updated as appropriate: allergies, current medications, past family history, past medical history, past social history, past surgical history and problem list.  Review of Systems Pertinent items are noted in HPI.   Objective:    BP (!) 116/59   Pulse (!) 57   General:  alert, cooperative and no distress   Breasts:  inspection negative, no nipple discharge or bleeding, no masses or nodularity palpable  Lungs: clear to auscultation bilaterally  Heart:  regular rate and rhythm, S1, S2 normal, no murmur, click, rub or gallop  Abdomen: soft, non-tender; bowel sounds normal; no masses,  no organomegaly   Vulva:  not evaluated  Vagina: not evaluated  Cervix:  not evaluated  Corpus: not examined  Adnexa:  not evaluated  Rectal Exam: Not performed.        Assessment:    Healthy  postpartum exam. Pap smear not done at today's visit.   Plan:    1. Contraception: vasectomy 2.  Patient in process of 2 hour GTT today.  3. Follow up in: 1 year or as needed.

## 2017-02-19 ENCOUNTER — Ambulatory Visit: Payer: Self-pay | Admitting: Obstetrics and Gynecology

## 2017-02-19 LAB — GLUCOSE TOLERANCE, 2 HOURS
GLUCOSE, 2 HOUR: 177 mg/dL — AB (ref 65–139)
Glucose, GTT - Fasting: 102 mg/dL — ABNORMAL HIGH (ref 65–99)

## 2017-02-24 ENCOUNTER — Ambulatory Visit: Payer: Self-pay | Admitting: Medical

## 2017-02-25 ENCOUNTER — Encounter: Payer: Self-pay | Admitting: Student

## 2017-02-25 ENCOUNTER — Telehealth: Payer: Self-pay | Admitting: Student

## 2017-02-25 DIAGNOSIS — E119 Type 2 diabetes mellitus without complications: Secondary | ICD-10-CM | POA: Insufficient documentation

## 2017-02-25 NOTE — Telephone Encounter (Signed)
Called patient and informed her of her 2 hour GTT test; patient understands diagnosis and follow-up plan.  [facility]  Luna KitchensKathryn Kooistra CNM

## 2019-04-07 IMAGING — US US FETAL BPP W/ NON-STRESS
1 series · 12 of 12 positions shown · non-contrast
Comparison: none

[Series 1: us fetal bpp w/nonstress · 12 acquisitions, 12 frames shown]
[im 1/12]
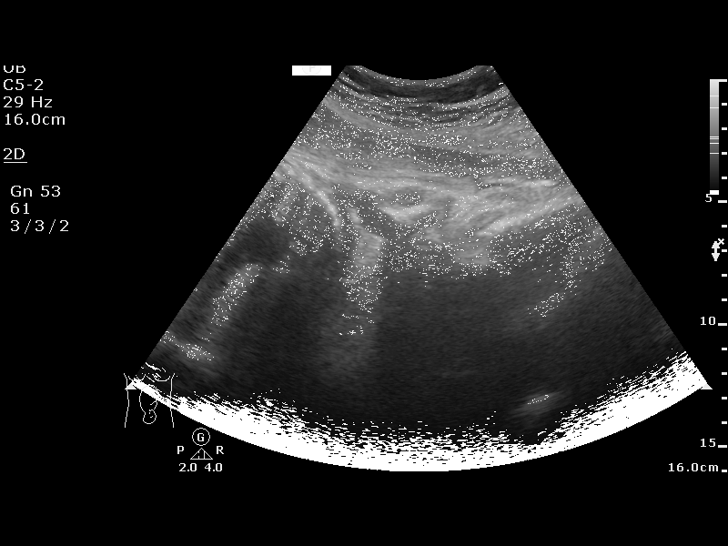
[im 2/12]
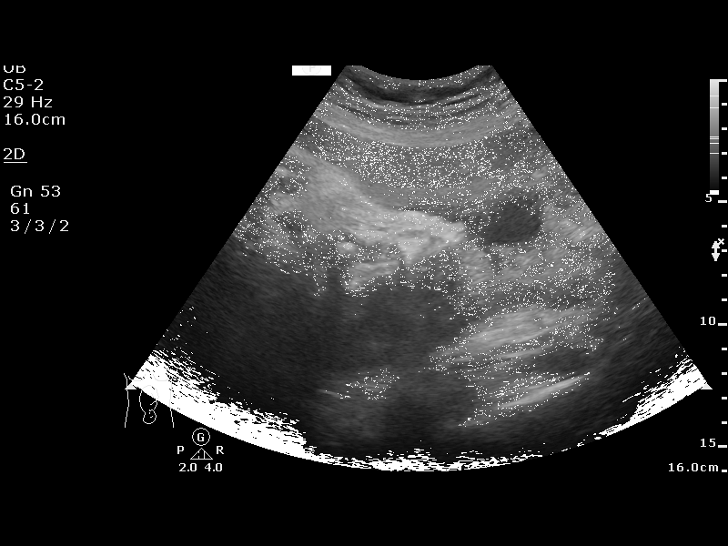
[im 3/12]
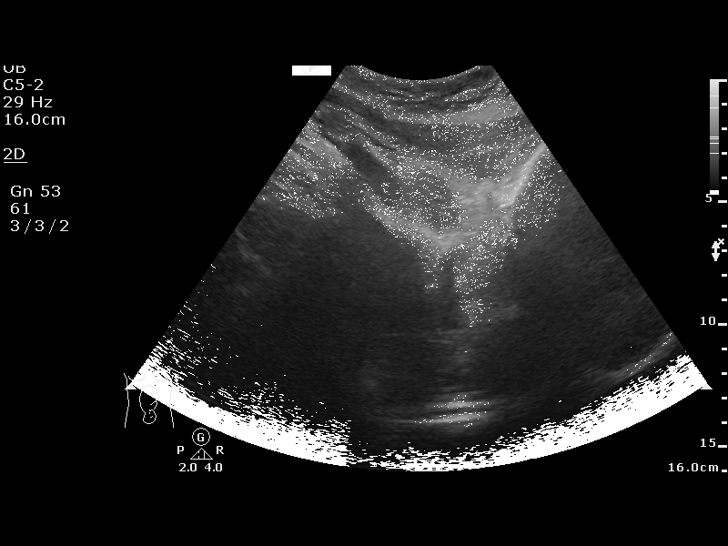
[im 4/12]
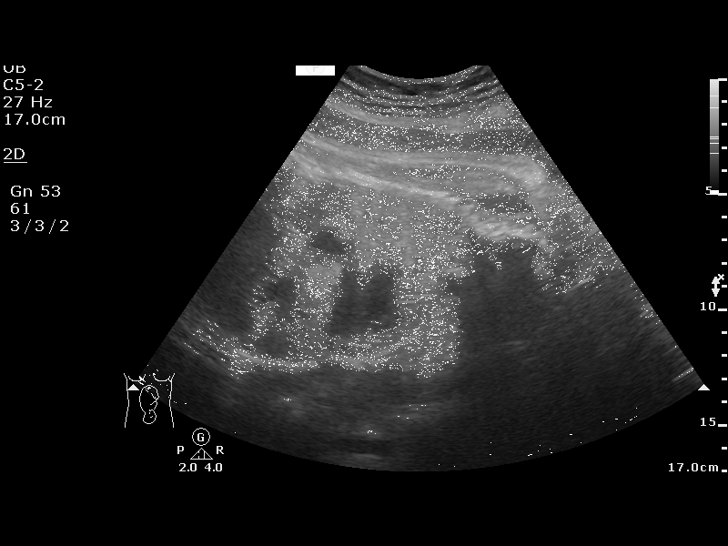
[im 5/12]
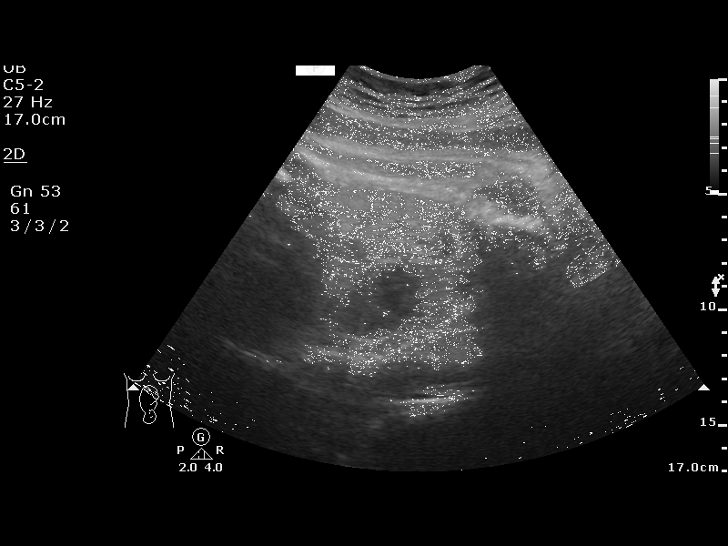
[im 6/12]
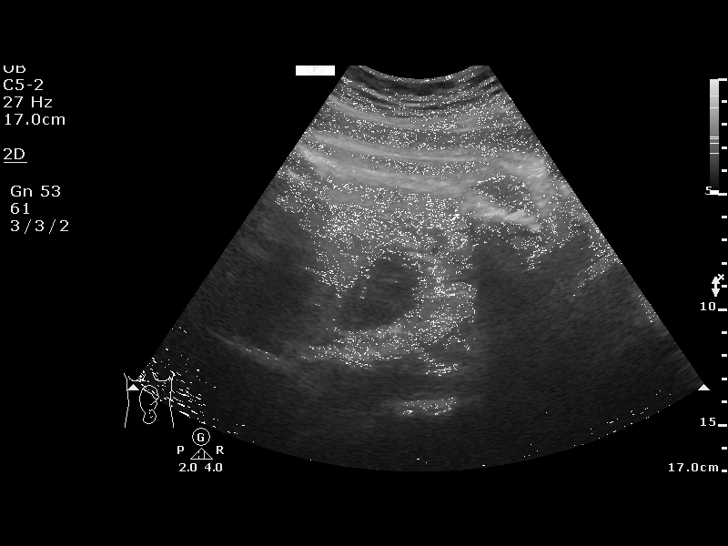
[im 7/12]
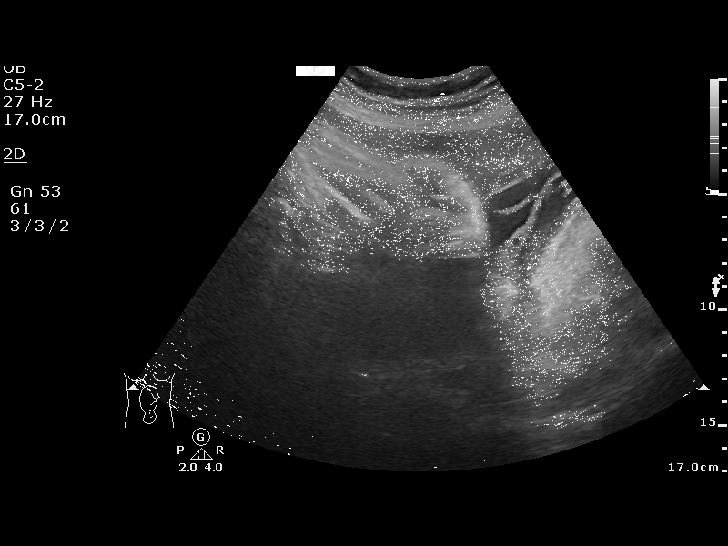
[im 8/12]
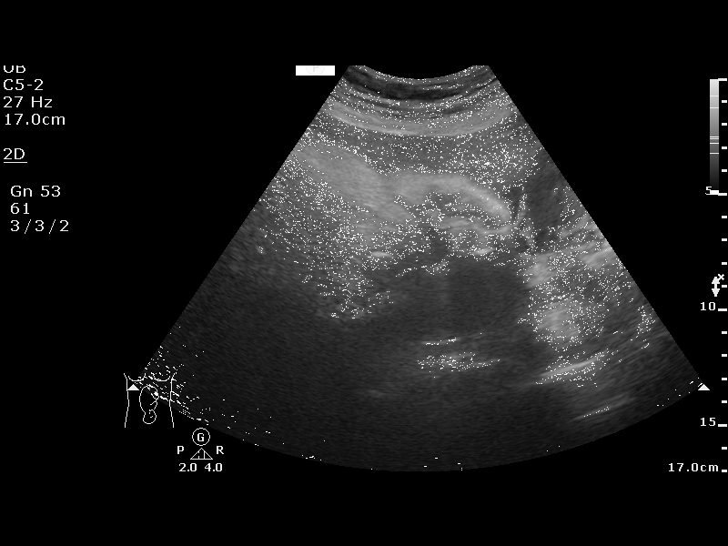
[im 9/12]
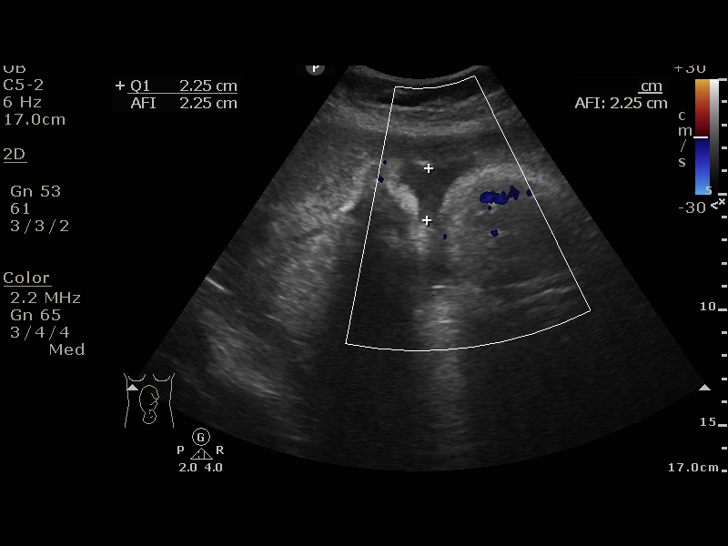
[im 10/12]
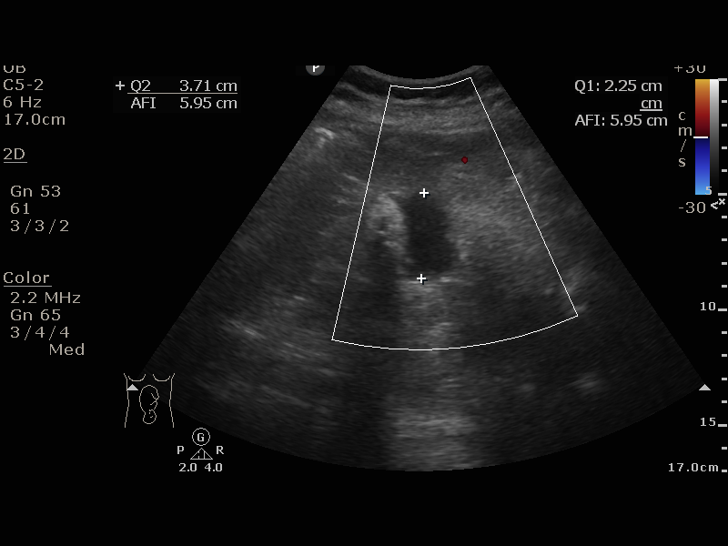
[im 11/12]
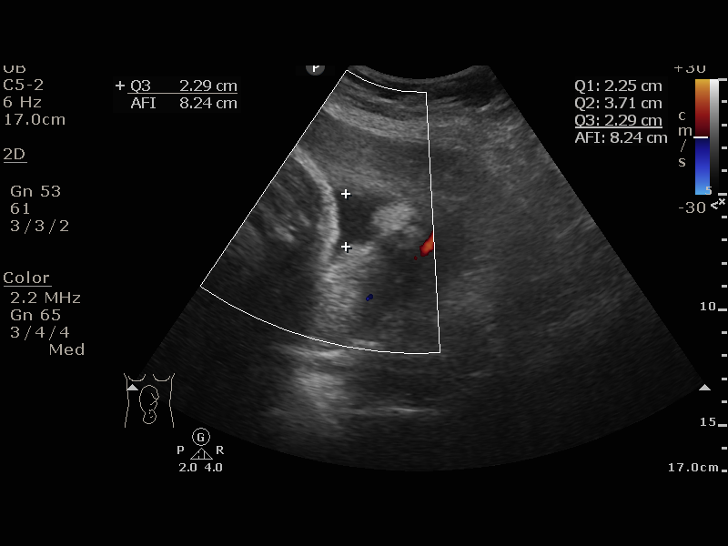
[im 12/12]
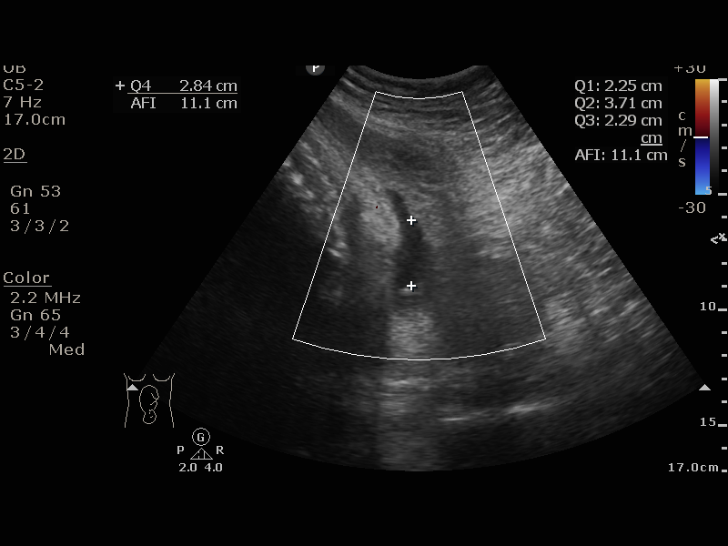

[12 of 12 positions shown; findings below may reference images not displayed]

[facility]
[REDACTED]

1  US FETAL BPP W/NONSTRESS                    76818.4

1  SALIJEVIC SABEDINI                [id number]      [id number]     [id number]
Service(s) Provided

Indications

36 weeks gestation of pregnancy
Gestational diabetes in pregnancy, insulin
controlled
OB History

Gravidity:    5         Term:   3        Prem:   0        SAB:   0
TOP:          1       Ectopic:  0        Living: 3
Fetal Evaluation

Num Of Fetuses:     1
Preg. Location:     Intrauterine
Cardiac Activity:   Observed
Presentation:       Cephalic

Amniotic Fluid
AFI FV:      Subjectively within normal limits

AFI Sum(cm)     %Tile       Largest Pocket(cm)
11.09           31
RUQ(cm)       RLQ(cm)       LUQ(cm)        LLQ(cm)
2.25
Biophysical Evaluation

Amniotic F.V:   Pocket => 2 cm two         F. Tone:        Observed
planes
F. Movement:    Observed                   N.S.T:          Reactive
F. Breathing:   Observed                   Score:          [DATE]
Gestational Age

LMP:           41w 2d        Date:  03/15/16                 EDD:   12/20/16
Best:          36w 3d     Det. By:  U/S  (09/29/16)          EDD:   01/23/17
Impression

IUP at  77w7d with GDM
Normal amniotic fluid volume
Recommendations

Continue recommended antenatal testing.
Recommend delivery by 39 weeks if maternal and fetal status
remain reassuring.

## 2019-04-16 IMAGING — US US MFM FETAL BPP W/O NON-STRESS
1 series · 15 of 24 positions shown · non-contrast
Comparison: none

[Series 1: us mfm fetal bpp w/o non-stress · 24 acquisitions, 15 frames shown]
[im 1/24]
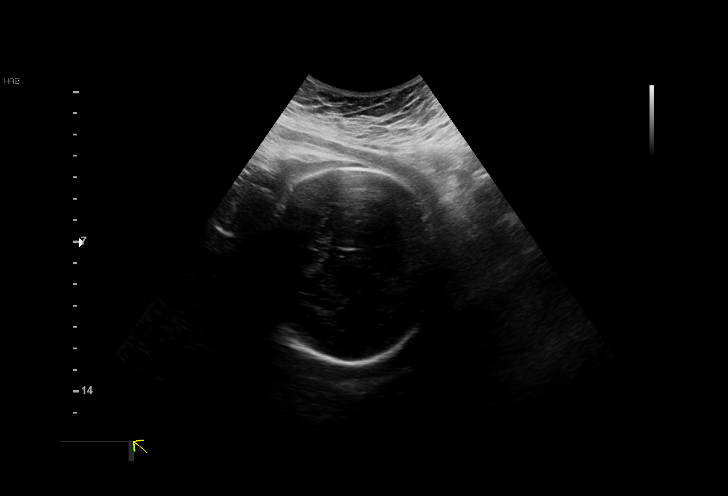
[im 3/24]
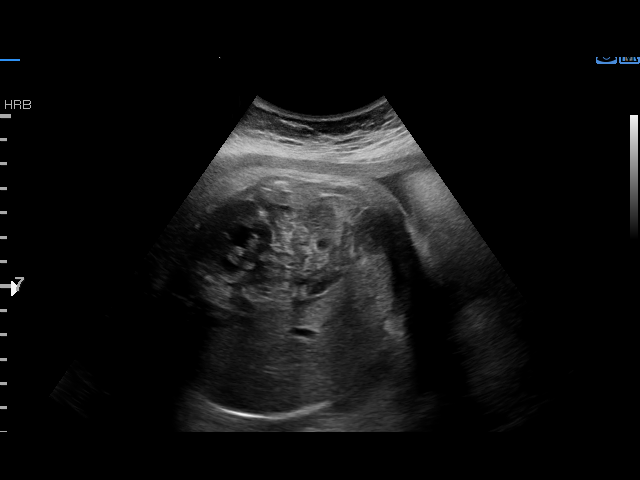
[im 5/24]
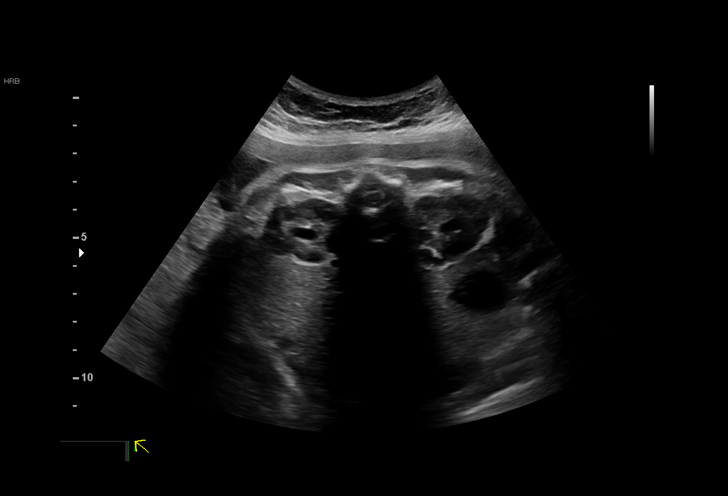
[im 6/24]
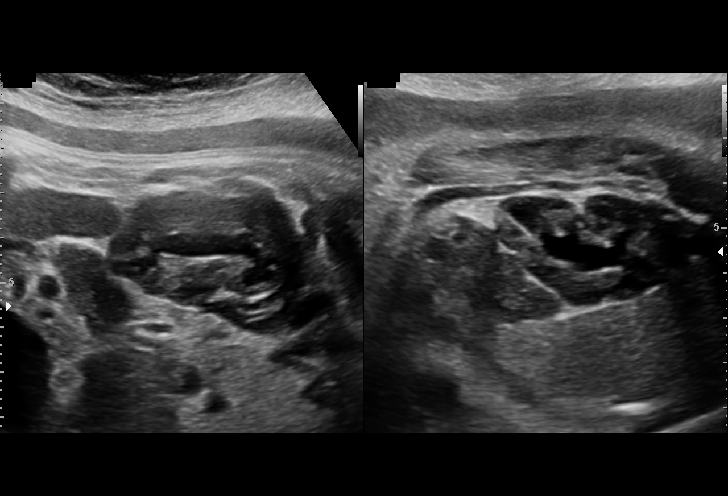
[im 8/24]
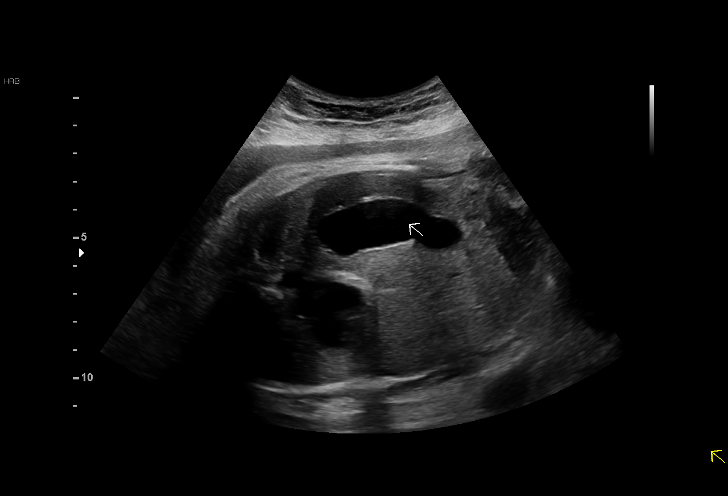
[im 9/24]
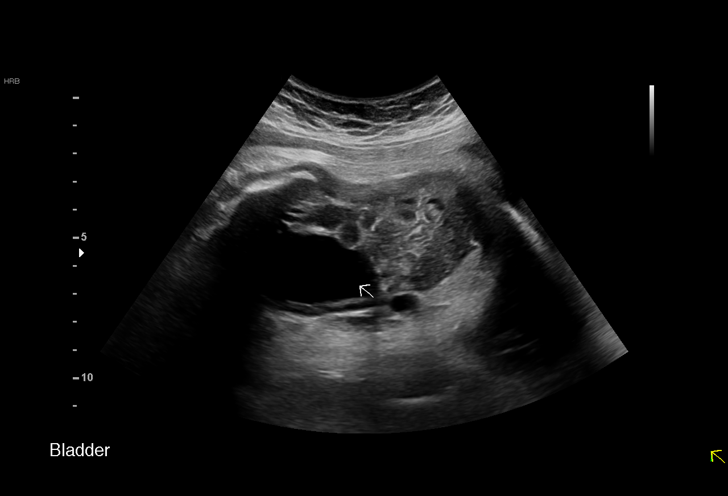
[im 11/24]
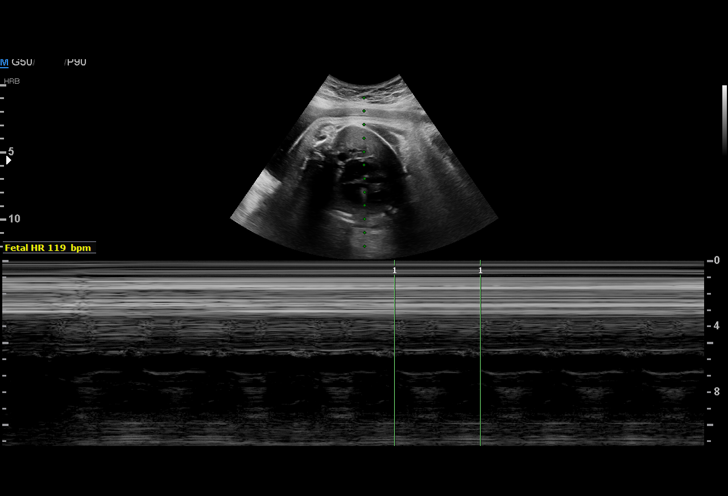
[im 13/24]
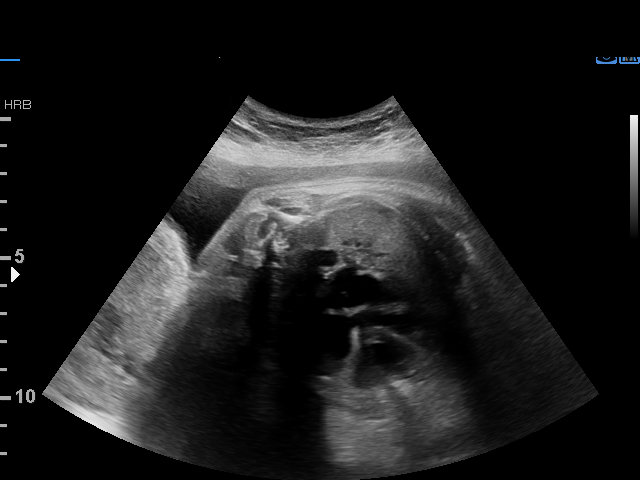
[im 14/24]
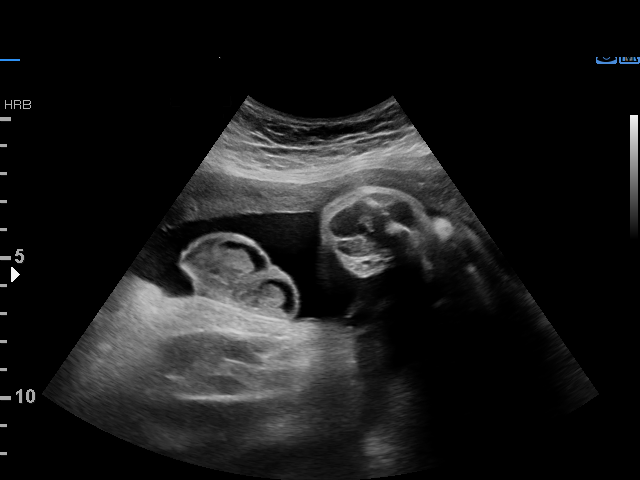
[im 16/24]
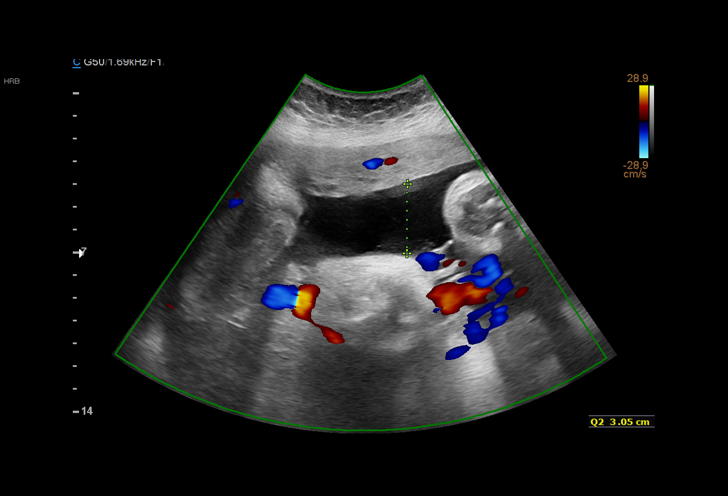
[im 17/24]
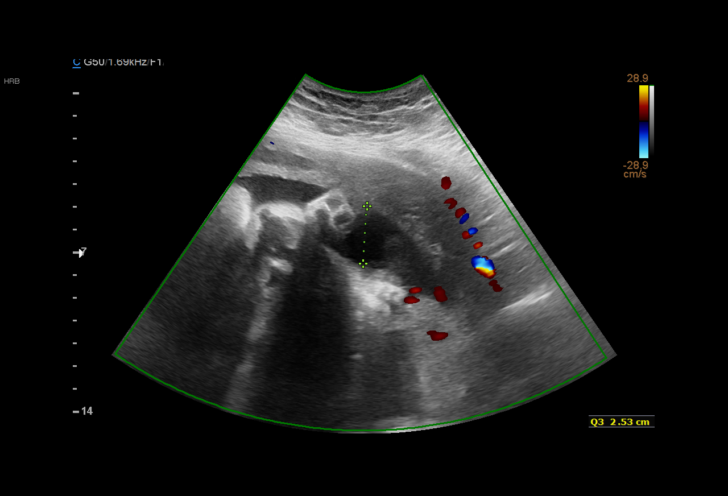
[im 19/24]
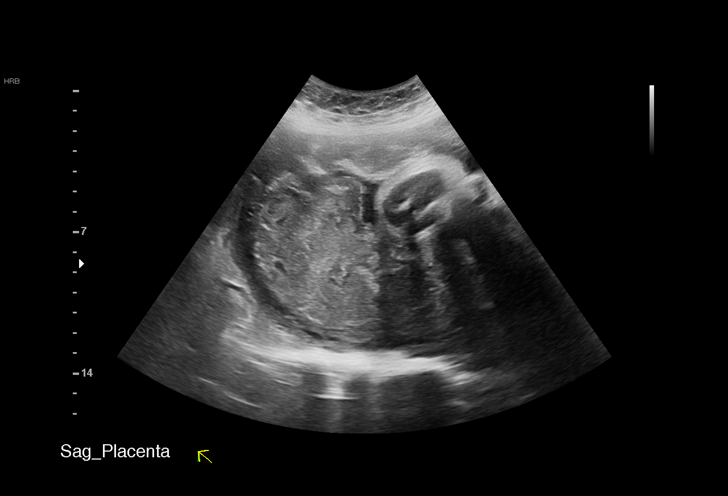
[im 21/24]
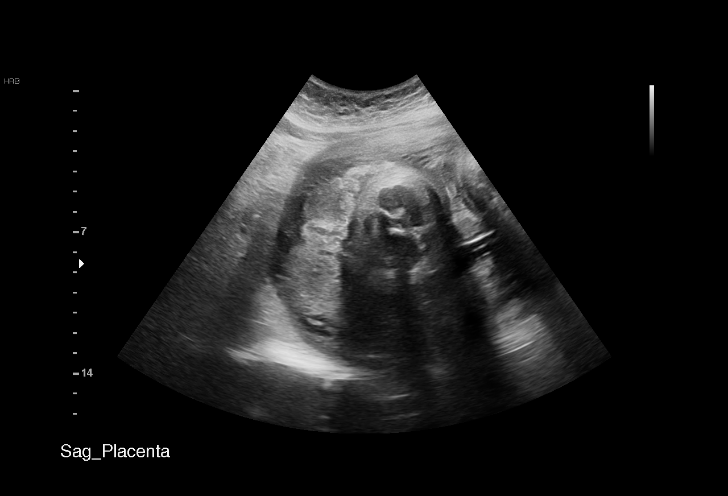
[im 22/24]
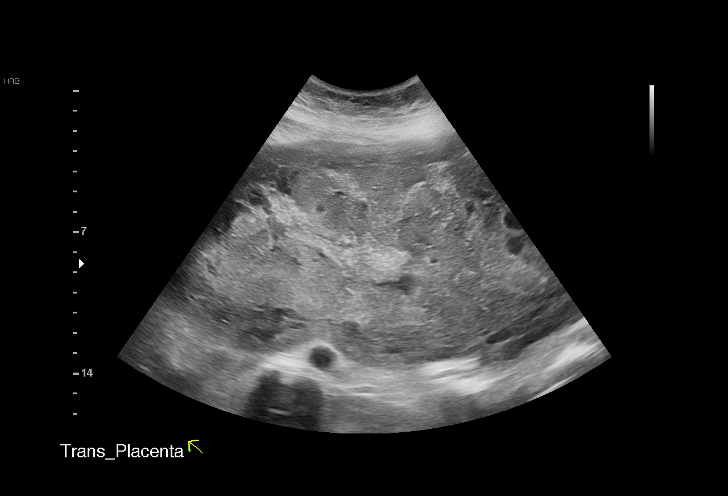
[im 24/24]
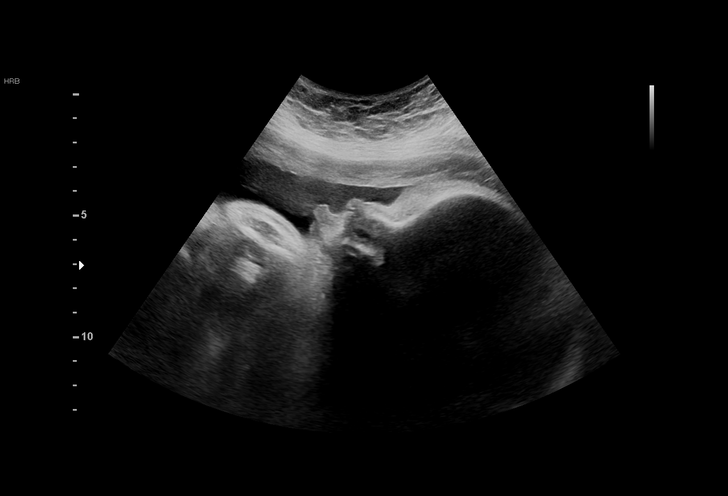

[15 of 24 positions shown; findings below may reference images not displayed]

OB/Gyn Clinic

1  JORGE A HEDLUND                [id number]      [id number]     [id number]
Indications

37 weeks gestation of pregnancy
Gestational diabetes in pregnancy, insulin
controlled
Advanced maternal age multigravida 35+,
third trimester (low risk NIPS)
Obesity complicating pregnancy, third
trimester
OB History

Gravidity:    5         Term:   3        Prem:   0        SAB:   0
TOP:          1       Ectopic:  0        Living: 3
Fetal Evaluation

Num Of Fetuses:     1
Fetal Heart         119
Rate(bpm):
Cardiac Activity:   Observed
Presentation:       Cephalic
Placenta:           Posterior, above cervical os
P. Cord Insertion:  Previously Visualized

Amniotic Fluid
AFI FV:      Subjectively low-normal

AFI Sum(cm)     %Tile       Largest Pocket(cm)
8.43            13
RUQ(cm)       RLQ(cm)       LUQ(cm)        LLQ(cm)
2.85          0
Biophysical Evaluation

Amniotic F.V:   Within normal limits       F. Tone:        Observed
F. Movement:    Observed                   N.S.T:          Reactive
F. Breathing:   Not Observed               Score:          [DATE]
Gestational Age

LMP:           42w 4d        Date:  03/15/16                 EDD:   12/20/16
Best:          37w 5d     Det. By:  U/S  (09/29/16)          EDD:   01/23/17
Anatomy

Thoracic:              Appears normal         Stomach:                Appears normal, left
sided
Heart:                 Appears normal         Bladder:                Appears normal
(4CH, axis, and situs
Cervix Uterus Adnexa

Cervix
Not visualized (advanced GA >36wks)
Impression

SIUP at 37+5 weeks
Cephalic presentation
Low normal amniotic fluid volume
BPP [DATE] (-2 for no breathing movement)
BPP [DATE] (NST reactive in office today)
Recommendations

Continue antenatal testing.
Recommend delivery by 39 weeks if maternal and fetal status
remain reassuring.

## 2019-07-06 ENCOUNTER — Ambulatory Visit: Payer: Self-pay | Admitting: Internal Medicine

## 2019-07-06 ENCOUNTER — Encounter: Payer: Self-pay | Admitting: Internal Medicine

## 2019-07-06 VITALS — BP 120/72 | HR 74 | Resp 12 | Ht 62.0 in | Wt 191.0 lb

## 2019-07-06 DIAGNOSIS — B373 Candidiasis of vulva and vagina: Secondary | ICD-10-CM

## 2019-07-06 DIAGNOSIS — E669 Obesity, unspecified: Secondary | ICD-10-CM

## 2019-07-06 DIAGNOSIS — R739 Hyperglycemia, unspecified: Secondary | ICD-10-CM

## 2019-07-06 DIAGNOSIS — E119 Type 2 diabetes mellitus without complications: Secondary | ICD-10-CM

## 2019-07-06 DIAGNOSIS — B3731 Acute candidiasis of vulva and vagina: Secondary | ICD-10-CM

## 2019-07-06 LAB — GLUCOSE, POCT (MANUAL RESULT ENTRY): POC Glucose: 302 mg/dl — AB (ref 70–99)

## 2019-07-06 MED ORDER — METFORMIN HCL ER 500 MG PO TB24: ORAL_TABLET | ORAL | 11 refills | Status: DC

## 2019-07-06 MED ORDER — FLUCONAZOLE 150 MG PO TABS: ORAL_TABLET | ORAL | 0 refills | Status: DC

## 2019-07-06 MED ORDER — GLIPIZIDE 5 MG PO TABS: 5.0000 mg | ORAL_TABLET | Freq: Two times a day (BID) | ORAL | 11 refills | Status: DC

## 2019-07-06 MED ORDER — TRUETRACK TEST VI STRP: ORAL_STRIP | 12 refills | Status: DC

## 2019-07-06 MED ORDER — LANCETS THIN MISC: 11 refills | Status: DC

## 2019-07-06 NOTE — Progress Notes (Signed)
Subjective:    Patient ID: Joanne Roberts, female   DOB: 1980-04-05, 39 y.o.   MRN: [id number]   HPI   Here to establish  1.  History of gestational diabetes--apparently her sugars continued to remain high after delivery in 01/2017.  Was told to establish with a primary, but did not and then the pandemic hit. Four months ago, she started having prolonged vaginal yeast infections that would not go away with topical antifungal treatments.  Having white vaginal discharge without odor, but with itching.  She checked her sugars and was running in high 400s.   She stopped her once daily soda intake.  Stopped eating desserts, candies and bread.   Sugars only decreased to 300.   DM runs in family--her father carries the diagnosis as does her maternal grandmother and maternal uncle.   States prior to pregnancy, she weighed in the 150s.   4 months ago, she weighed 218 lbs.     No outpatient medications have been marked as taking for the 07/06/19 encounter (Office Visit) with Mack Hook, MD.   No Known Allergies   Past Medical History:  Diagnosis Date  . Anemia   . Gestational diabetes    insulin  . Gestational diabetes mellitus (GDM), antepartum 10/08/2016   Current Diabetic Medications:  Regular 16 units before breakfast, 20 before dinner NPH 45 units before breakfast, 18 units at bedtime [NA] Aspirin 81 mg daily after 12 weeks; discontinue after 36 weeks (? A2/B GDM)  Required Referrals for A1GDM or A2GDM: [x]  Diabetes Education and Testing Supplies [x]  Nutrition Cousult  Baseline and surveillance labs (pulled in from EPIC, refresh links as needed)     Past Surgical History:  Procedure Laterality Date  . NO PAST SURGERIES      Family History  Problem Relation Age of Onset  . Diabetes Father    Social History   Socioeconomic History  . Marital status: Married    Spouse name: Not on file  . Number of children: Not on file  . Years of education: Not on file  .  Highest education level: Not on file  Occupational History  . Not on file  Tobacco Use  . Smoking status: Never Smoker  . Smokeless tobacco: Never Used  Substance and Sexual Activity  . Alcohol use: No  . Drug use: No  . Sexual activity: Not Currently  Other Topics Concern  . Not on file  Social History Narrative  . Not on file   Social Determinants of Health   Financial Resource Strain:   . Difficulty of Paying Living Expenses:   Food Insecurity:   . Worried About Charity fundraiser in the Last Year:   . Arboriculturist in the Last Year:   Transportation Needs:   . Film/video editor (Medical):   Marland Kitchen Lack of Transportation (Non-Medical):   Physical Activity:   . Days of Exercise per Week:   . Minutes of Exercise per Session:   Stress:   . Feeling of Stress :   Social Connections:   . Frequency of Communication with Friends and Family:   . Frequency of Social Gatherings with Friends and Family:   . Attends Religious Services:   . Active Member of Clubs or Organizations:   . Attends Archivist Meetings:   Marland Kitchen Marital Status:   Intimate Partner Violence:   . Fear of Current or Ex-Partner:   . Emotionally Abused:   Marland Kitchen Physically Abused:   .  Sexually Abused:       Review of Systems    Objective:   BP 120/72 (BP Location: Left Arm, Patient Position: Sitting, Cuff Size: Normal)   Pulse 74   Resp 12   Ht 5\' 2"  (1.575 m)   Wt 191 lb (86.6 kg)   LMP 07/01/2019   Breastfeeding No   BMI 34.93 kg/m   Physical Exam  NAD HEENT:  PERRL, EOMI, TMs pearly gray Neck:  Supple, No adenopathy, no thyromegaly Chest:  CTA CV:  RRR with normal S1 and S2, No S3, S4 or murmur.  Carotid, radial and DP/PT pulses normal and equal Abd:  S, NT, no HSM or mass, + BS LE:  No edema.   Assessment & Plan   1.  NIDDM/Obesity:  Glipizide 5 mg and Metformin XR 500 mg twice daily with meals.  Glucose monitoring supplies.   Lifestyle modifications discussed. CBC, CMP,  A1C, TSH  2.  Yeast vaginitis:  Control DM.  Fluconazole 150 mg daily for 3 days.

## 2019-07-07 LAB — CBC WITH DIFFERENTIAL/PLATELET
Basophils Absolute: 0 10*3/uL (ref 0.0–0.2)
Basos: 1 %
EOS (ABSOLUTE): 0.2 10*3/uL (ref 0.0–0.4)
Eos: 3 %
Hematocrit: 34.7 % (ref 34.0–46.6)
Hemoglobin: 10 g/dL — ABNORMAL LOW (ref 11.1–15.9)
Immature Grans (Abs): 0 10*3/uL (ref 0.0–0.1)
Immature Granulocytes: 0 %
Lymphocytes Absolute: 2.1 10*3/uL (ref 0.7–3.1)
Lymphs: 25 %
MCH: 21.3 pg — ABNORMAL LOW (ref 26.6–33.0)
MCHC: 28.8 g/dL — ABNORMAL LOW (ref 31.5–35.7)
MCV: 74 fL — ABNORMAL LOW (ref 79–97)
Monocytes Absolute: 0.4 10*3/uL (ref 0.1–0.9)
Monocytes: 5 %
Neutrophils Absolute: 5.4 10*3/uL (ref 1.4–7.0)
Neutrophils: 66 %
Platelets: 288 10*3/uL (ref 150–450)
RBC: 4.7 x10E6/uL (ref 3.77–5.28)
RDW: 17 % — ABNORMAL HIGH (ref 11.7–15.4)
WBC: 8.2 10*3/uL (ref 3.4–10.8)

## 2019-07-07 LAB — COMPREHENSIVE METABOLIC PANEL
ALT: 39 IU/L — ABNORMAL HIGH (ref 0–32)
AST: 37 IU/L (ref 0–40)
Albumin/Globulin Ratio: 1.1 — ABNORMAL LOW (ref 1.2–2.2)
Albumin: 3.9 g/dL (ref 3.8–4.8)
Alkaline Phosphatase: 117 IU/L (ref 48–121)
BUN/Creatinine Ratio: 17 (ref 9–23)
BUN: 10 mg/dL (ref 6–20)
Bilirubin Total: <0.2 mg/dL (ref 0.0–1.2)
CO2: 23 mmol/L (ref 20–29)
Calcium: 9.4 mg/dL (ref 8.7–10.2)
Chloride: 100 mmol/L (ref 96–106)
Creatinine, Ser: 0.58 mg/dL (ref 0.57–1.00)
GFR calc Af Amer: 135 mL/min/{1.73_m2} (ref >59)
GFR calc non Af Amer: 117 mL/min/{1.73_m2} (ref >59)
Globulin, Total: 3.4 g/dL (ref 1.5–4.5)
Glucose: 236 mg/dL — ABNORMAL HIGH (ref 65–99)
Potassium: 4.4 mmol/L (ref 3.5–5.2)
Sodium: 137 mmol/L (ref 134–144)
Total Protein: 7.3 g/dL (ref 6.0–8.5)

## 2019-07-07 LAB — HGB A1C W/O EAG: Hgb A1c MFr Bld: 10.4 % — ABNORMAL HIGH (ref 4.8–5.6)

## 2019-07-07 LAB — TSH: TSH: 1.96 u[IU]/mL (ref 0.450–4.500)

## 2019-08-18 ENCOUNTER — Encounter: Payer: Self-pay | Admitting: Internal Medicine

## 2019-08-18 ENCOUNTER — Ambulatory Visit: Payer: Self-pay | Admitting: Internal Medicine

## 2019-08-18 ENCOUNTER — Other Ambulatory Visit: Payer: Self-pay

## 2019-08-18 VITALS — BP 112/72 | HR 82 | Resp 12 | Ht 62.0 in | Wt 192.0 lb

## 2019-08-18 DIAGNOSIS — B3731 Acute candidiasis of vulva and vagina: Secondary | ICD-10-CM

## 2019-08-18 DIAGNOSIS — E669 Obesity, unspecified: Secondary | ICD-10-CM

## 2019-08-18 DIAGNOSIS — Z23 Encounter for immunization: Secondary | ICD-10-CM

## 2019-08-18 DIAGNOSIS — E119 Type 2 diabetes mellitus without complications: Secondary | ICD-10-CM

## 2019-08-18 DIAGNOSIS — B373 Candidiasis of vulva and vagina: Secondary | ICD-10-CM

## 2019-08-18 NOTE — Progress Notes (Signed)
    Subjective:    Patient ID: Joanne Roberts, female   DOB: 03-09-1980, 39 y.o.   MRN: [id number]   HPI   1.  DM:  Is taking Glipizide 5 mg and Metformin XR 1000 mg twice daily.  Is checking her sugars twice daily.  All less than 200.  In the morning more in the 130 to 160 range and higher in the evening, but checking her sugars 1 to 2 hours postprandial then. No longer with thirst and polyuria she had prior to start of meds.   Describes an improved diet with decreased carbs and increase in percentage of fiber.  Walking 2-3 times weekly as well. Discussed making small incremental goals with diet and physical activity on weekly basis. A1C was 10.4% on 07/06/19 at start of meds.  2.  Anemia:  Heavy regular periods.  Has not started iron supplementation.  Recommended starting ferrous gluconate back in mid July following labs, but has not had time to pick up yet.  3.  COVID:  Completed vaccination with [place] on 07/28/19.  4.  Yeast Vaginitis:  Resolved with Fluconazole and improvement of blood glucose.    Current Meds  Medication Sig  . Blood Glucose Monitoring Suppl (ACCU-CHEK NANO SMARTVIEW) w/Device KIT 1 kit by Subdermal route as directed. Check blood sugars for fasting, and two hours after breakfast, lunch and dinner (4 checks daily)  . glipiZIDE (GLUCOTROL) 5 MG tablet Take 1 tablet (5 mg total) by mouth 2 (two) times daily before a meal.  . glucose blood (TRUETRACK TEST) test strip Check blood sugars twice daily before meals  . Lancets Thin MISC Check blood glucose twice daily  . metFORMIN (GLUCOPHAGE-XR) 500 MG 24 hr tablet 1 tab by mouth twice daily for 7 days, then 2 tabs twice daily with meals thereafter.   No Known Allergies   Review of Systems    Objective:   BP 112/72 (BP Location: Left Arm, Patient Position: Sitting, Cuff Size: Normal)   Pulse 82   Resp 12   Ht 5' 2" (1.575 m)   Wt 192 lb (87.1 kg)   LMP 07/26/2019 (Approximate)   BMI 35.12 kg/m    Physical Exam  NAD Lungs:  CTA CV:  RRR without murmur or rub.  Radial and DP pulses normal and equal LE:  No edema.   Assessment & Plan   1.  DM/Obesity:  Improving control.  To continue to work on dietary and physical activity changes.   [facility] referral. Pneumococcal 23 valent Schedule for flu vaccine clinic Fasting labs in 3 months:  FLP, CBC, A1C, Urine microalbumin and with me 2 days later.  2.  Yeast vaginitis: resolved  3.  Anemia: to get started on Ferrous gluconate.  Take with a tangerine of half and orange daily.  4.  COVID:  Vaccination complete.

## 2019-10-06 ENCOUNTER — Other Ambulatory Visit: Payer: Self-pay

## 2019-11-17 ENCOUNTER — Other Ambulatory Visit: Payer: Self-pay

## 2019-11-20 ENCOUNTER — Ambulatory Visit: Payer: Self-pay | Admitting: Internal Medicine

## 2020-01-15 ENCOUNTER — Other Ambulatory Visit: Payer: Self-pay | Admitting: Internal Medicine

## 2020-01-15 ENCOUNTER — Other Ambulatory Visit: Payer: Self-pay

## 2020-01-17 ENCOUNTER — Ambulatory Visit: Payer: Self-pay | Admitting: Internal Medicine

## 2020-02-11 ENCOUNTER — Telehealth: Payer: Self-pay | Admitting: Internal Medicine

## 2020-02-13 NOTE — Telephone Encounter (Signed)
Appointment has been scheduled for 02/19/2020

## 2020-02-19 ENCOUNTER — Other Ambulatory Visit: Payer: Self-pay | Admitting: Internal Medicine

## 2020-03-18 ENCOUNTER — Other Ambulatory Visit: Payer: Self-pay

## 2020-03-18 ENCOUNTER — Other Ambulatory Visit: Payer: Self-pay | Admitting: Internal Medicine

## 2020-03-18 DIAGNOSIS — D649 Anemia, unspecified: Secondary | ICD-10-CM

## 2020-03-18 DIAGNOSIS — E119 Type 2 diabetes mellitus without complications: Secondary | ICD-10-CM

## 2020-03-18 DIAGNOSIS — R748 Abnormal levels of other serum enzymes: Secondary | ICD-10-CM

## 2020-03-19 LAB — COMPREHENSIVE METABOLIC PANEL
ALT: 43 IU/L — ABNORMAL HIGH (ref 0–32)
AST: 31 IU/L (ref 0–40)
Albumin/Globulin Ratio: 1.2 (ref 1.2–2.2)
Albumin: 4.1 g/dL (ref 3.8–4.8)
Alkaline Phosphatase: 111 IU/L (ref 44–121)
BUN/Creatinine Ratio: 18 (ref 9–23)
BUN: 10 mg/dL (ref 6–20)
Bilirubin Total: <0.2 mg/dL (ref 0.0–1.2)
CO2: 19 mmol/L — ABNORMAL LOW (ref 20–29)
Calcium: 8.9 mg/dL (ref 8.7–10.2)
Chloride: 102 mmol/L (ref 96–106)
Creatinine, Ser: 0.55 mg/dL — ABNORMAL LOW (ref 0.57–1.00)
Globulin, Total: 3.3 g/dL (ref 1.5–4.5)
Glucose: 147 mg/dL — ABNORMAL HIGH (ref 65–99)
Potassium: 4.3 mmol/L (ref 3.5–5.2)
Sodium: 136 mmol/L (ref 134–144)
Total Protein: 7.4 g/dL (ref 6.0–8.5)
eGFR: 119 mL/min/{1.73_m2} (ref >59)

## 2020-03-19 LAB — HGB A1C W/O EAG: Hgb A1c MFr Bld: 7.3 % — ABNORMAL HIGH (ref 4.8–5.6)

## 2020-03-19 LAB — CBC WITH DIFFERENTIAL/PLATELET
Basophils Absolute: 0.1 10*3/uL (ref 0.0–0.2)
Basos: 1 %
EOS (ABSOLUTE): 0.3 10*3/uL (ref 0.0–0.4)
Eos: 4 %
Hematocrit: 32.2 % — ABNORMAL LOW (ref 34.0–46.6)
Hemoglobin: 9.6 g/dL — ABNORMAL LOW (ref 11.1–15.9)
Immature Grans (Abs): 0 10*3/uL (ref 0.0–0.1)
Immature Granulocytes: 1 %
Lymphocytes Absolute: 3 10*3/uL (ref 0.7–3.1)
Lymphs: 34 %
MCH: 22 pg — ABNORMAL LOW (ref 26.6–33.0)
MCHC: 29.8 g/dL — ABNORMAL LOW (ref 31.5–35.7)
MCV: 74 fL — ABNORMAL LOW (ref 79–97)
Monocytes Absolute: 0.4 10*3/uL (ref 0.1–0.9)
Monocytes: 4 %
Neutrophils Absolute: 4.9 10*3/uL (ref 1.4–7.0)
Neutrophils: 56 %
Platelets: 351 10*3/uL (ref 150–450)
RBC: 4.36 x10E6/uL (ref 3.77–5.28)
RDW: 16.9 % — ABNORMAL HIGH (ref 11.7–15.4)
WBC: 8.7 10*3/uL (ref 3.4–10.8)

## 2020-03-19 LAB — MICROALBUMIN / CREATININE URINE RATIO
Creatinine, Urine: 85.4 mg/dL
Microalb/Creat Ratio: 122 mg/g creat — ABNORMAL HIGH (ref 0–29)
Microalbumin, Urine: 104.3 ug/mL

## 2020-03-19 LAB — LIPID PANEL W/O CHOL/HDL RATIO
Cholesterol, Total: 204 mg/dL — ABNORMAL HIGH (ref 100–199)
HDL: 63 mg/dL (ref >39)
LDL Chol Calc (NIH): 110 mg/dL — ABNORMAL HIGH (ref 0–99)
Triglycerides: 183 mg/dL — ABNORMAL HIGH (ref 0–149)
VLDL Cholesterol Cal: 31 mg/dL (ref 5–40)

## 2020-04-03 ENCOUNTER — Telehealth: Payer: Self-pay | Admitting: Internal Medicine

## 2020-04-03 NOTE — Telephone Encounter (Signed)
Patient called asking for the results of her labs, if we can please let her know the results when they're ready.

## 2020-04-18 NOTE — Telephone Encounter (Signed)
Results were given to the patient

## 2020-04-29 ENCOUNTER — Telehealth: Payer: Self-pay | Admitting: Internal Medicine

## 2020-04-29 NOTE — Telephone Encounter (Signed)
Patient called requesting an advise. Patient stated that she found a small growth in her mandible and it is causing her pain. Also, she mentioned her upper right gum has inflammation and is causing pain when eating. Patient has used hot compresses but is not helping much. No other symptoms. Patient has an appointment 05/15/2020. Please advise.

## 2020-04-30 NOTE — Telephone Encounter (Signed)
Work in as an acute in next couple of weeks or place on wait list for same.

## 2020-05-01 ENCOUNTER — Other Ambulatory Visit: Payer: Self-pay

## 2020-05-01 ENCOUNTER — Encounter: Payer: Self-pay | Admitting: Internal Medicine

## 2020-05-01 ENCOUNTER — Ambulatory Visit: Payer: Self-pay | Admitting: Internal Medicine

## 2020-05-01 VITALS — BP 124/80 | HR 60 | Resp 18 | Ht 62.0 in | Wt 184.0 lb

## 2020-05-01 DIAGNOSIS — K056 Periodontal disease, unspecified: Secondary | ICD-10-CM | POA: Insufficient documentation

## 2020-05-01 DIAGNOSIS — K047 Periapical abscess without sinus: Secondary | ICD-10-CM

## 2020-05-01 MED ORDER — PENICILLIN V POTASSIUM 500 MG PO TABS: ORAL_TABLET | ORAL | 0 refills | Status: DC

## 2020-05-01 MED ORDER — FLUCONAZOLE 150 MG PO TABS: ORAL_TABLET | ORAL | 0 refills | Status: DC

## 2020-05-01 NOTE — Patient Instructions (Signed)
You need to be seen by a dentist

## 2020-05-01 NOTE — Progress Notes (Signed)
    Subjective:    Patient ID: Joanne Roberts, female   DOB: [birth year], 40 y.o.   MRN: [id number]   HPI   Mass in left mandible for about 1 week.  Has not enlarged too much, though notes increasing and decreasing surrounding swelling.   Some pain with chewing on that side.  Has also noted gingival swelling on left side of mouth, but frontal maxillary area, not mandibular.   No drainage or bad taste in mouth.  No fever. Does have pain and tenderness when palpates area for which she has used Tylenol.  1300 mg twice daily. Does not have an orange card.    Checking sugars--running 120-140.  Current Meds  Medication Sig  . Blood Glucose Monitoring Suppl (ACCU-CHEK NANO SMARTVIEW) w/Device KIT 1 kit by Subdermal route as directed. Check blood sugars for fasting, and two hours after breakfast, lunch and dinner (4 checks daily)  . glipiZIDE (GLUCOTROL) 5 MG tablet Take 1 tablet (5 mg total) by mouth 2 (two) times daily before a meal.  . glucose blood (TRUETRACK TEST) test strip Check blood sugars twice daily before meals  . Lancets Thin MISC Check blood glucose twice daily  . metFORMIN (GLUCOPHAGE-XR) 500 MG 24 hr tablet 1 tab by mouth twice daily for 7 days, then 2 tabs twice daily with meals thereafter.   No Known Allergies   Review of Systems    Objective:   BP 124/80 (BP Location: Left Arm, Patient Position: Sitting, Cuff Size: Normal)   Pulse 60   Resp 18   Ht 5' 2" (1.575 m)   Wt 184 lb (83.5 kg)   LMP 04/12/2020 (Approximate)   BMI 33.65 kg/m   Physical Exam  NAD HEENT:  PERRL, EOMI, TMs pearly gray.  Diffuse swollen and erythematous gingiva with significant gingival recession and multiple loose teeth.  Scattered gray/black cavities as well.  Deep hard swelling without obvious fluctuance midway along mandible on left.  No overlying erythema. Able to palpate same swelling through buccal mucosa on inside of mouth.  Tender. Neck:  Supple, No adenopathy. Chest:   CTA CV:  RRR without murmur or rub.  Radial pulses normal and equal.   Assessment & Plan  1.  Diffuse periodontal disease with left mandibular likely dental abscess:  Pen V K 500 mg 4 times daily for 7 days. Tylenol 1 g twice daily for pain. Gargle with Listerine or similar. To call if worsens or no improvement in 48 hours. Gave patient list of dentists she can contact for lower cost care.  Encouraged her to obtain an orange card.  2.  Fluconazole 150 mg x 2 days if develops yeast infection.  Has appt soon for follow up of DM

## 2020-05-01 NOTE — Telephone Encounter (Signed)
Appointment was given to the patient on 05/01/2020

## 2020-05-13 ENCOUNTER — Other Ambulatory Visit: Payer: Self-pay | Admitting: Internal Medicine

## 2020-05-15 ENCOUNTER — Ambulatory Visit: Payer: Self-pay | Admitting: Internal Medicine

## 2020-05-15 ENCOUNTER — Encounter: Payer: Self-pay | Admitting: Internal Medicine

## 2020-05-15 ENCOUNTER — Other Ambulatory Visit: Payer: Self-pay

## 2020-05-15 VITALS — BP 118/70 | HR 60 | Resp 18 | Ht 62.0 in | Wt 199.8 lb

## 2020-05-15 DIAGNOSIS — E669 Obesity, unspecified: Secondary | ICD-10-CM

## 2020-05-15 DIAGNOSIS — K056 Periodontal disease, unspecified: Secondary | ICD-10-CM

## 2020-05-15 DIAGNOSIS — E119 Type 2 diabetes mellitus without complications: Secondary | ICD-10-CM

## 2020-05-15 DIAGNOSIS — K047 Periapical abscess without sinus: Secondary | ICD-10-CM

## 2020-05-15 NOTE — Progress Notes (Signed)
    Subjective:    Patient ID: Joanne Roberts, female   DOB: Dec 06, 1980, 40 y.o.   MRN: [id number]   HPI   1.  DM:  Not checking sugars.  Did not bring in documentation.  Her A1C in March down to 7.3% in March.  They are outside a lot and going for walks in the park a couple times a day.   She is working on eating healthy.   2.  Anemia:  Did bump up her iron to 3 times daily about 2 weeks ago when we notified her of lower hemoglobin.  She has had anemia issues for a long time.  Periods are not really heavy.  3.  Abscessed tooth:  Did go to dentist.  She did get a deep cleaning.  She has an appt coming up to do this again.  She cannot remember the name of the dentist in [place].  Current Meds  Medication Sig  . Blood Glucose Monitoring Suppl (ACCU-CHEK NANO SMARTVIEW) w/Device KIT 1 kit by Subdermal route as directed. Check blood sugars for fasting, and two hours after breakfast, lunch and dinner (4 checks daily)  . ferrous gluconate (FERGON) 324 MG tablet Take 1 tablet (324 mg total) by mouth daily with breakfast.  . glipiZIDE (GLUCOTROL) 5 MG tablet Take 1 tablet (5 mg total) by mouth 2 (two) times daily before a meal.  . glucose blood (TRUETRACK TEST) test strip Check blood sugars twice daily before meals  . Lancets Thin MISC Check blood glucose twice daily  . metFORMIN (GLUCOPHAGE-XR) 500 MG 24 hr tablet 1 tab by mouth twice daily for 7 days, then 2 tabs twice daily with meals thereafter.   No Known Allergies   Review of Systems    Objective:   BP 118/70 (BP Location: Right Arm, Patient Position: Sitting, Cuff Size: Normal)   Pulse 60   Resp 18   Ht $R'5\' 2"'Iy$  (1.575 m)   Wt 199 lb 12 oz (90.6 kg)   LMP 05/15/2020 (Exact Date)   BMI 36.53 kg/m   Physical Exam  NAD HEENT:  PERRL, EOMI, Teeth and gums much healthier.  No inflammation of gums and plaque gone.  Significant recession of gums, however.  NT over left mandible and no definite induration or swelling today.    Neck:  Supple, No adenopathy Chest:  CTA CV:  RRR without murmur or rub.  Radial and DP pulses normal and equal. LE:  No edema.   Assessment & Plan   1.  DM:  Improved in March.  Not checking sugars, so not clear if maintaining.  Encouraged to continue to work on diet and physical activity.   Microalbuminuria--holding on ACE inhibitor for now until her husband can get his vasectomy.  Very mild.   Recheck in 2 months with her improved diabetic control as may have improved by then.  2.  Obesity:  As above.  3.  Anemia:  To continue with twice daily iron supplementation.  Check in 2 more months.  4.  Dental abscess and periodontal disease: much improved with penicillin and deep cleaning.  Has a good dental regimen now.

## 2020-06-06 ENCOUNTER — Telehealth: Payer: Self-pay | Admitting: Internal Medicine

## 2020-06-06 MED ORDER — METFORMIN HCL ER 500 MG PO TB24: ORAL_TABLET | ORAL | 0 refills | Status: DC

## 2020-06-06 MED ORDER — GLIPIZIDE 5 MG PO TABS: 5.0000 mg | ORAL_TABLET | Freq: Two times a day (BID) | ORAL | 0 refills | Status: DC

## 2020-06-06 NOTE — Telephone Encounter (Signed)
Called patient to let her know that her Rx is ready to get picked up and it will last until her next appointment.

## 2020-06-06 NOTE — Telephone Encounter (Signed)
Patient called to ask if she could get a refill for glipiZIDE (GLUCOTROL) 5 MG tablet. Patient has ran out of pills she only has enough until Friday.   Her appointment is on July 22, 2020 for a lab and in July 24, 2020 for a follow up.   Patient would like to have one more refill till her next appointment for right now. 06/06/20

## 2020-07-15 ENCOUNTER — Other Ambulatory Visit: Payer: Self-pay | Admitting: Internal Medicine

## 2020-07-22 ENCOUNTER — Other Ambulatory Visit: Payer: Self-pay

## 2020-07-24 ENCOUNTER — Ambulatory Visit: Payer: Self-pay | Admitting: Internal Medicine

## 2020-07-28 ENCOUNTER — Encounter: Payer: Self-pay | Admitting: Internal Medicine

## 2020-08-02 ENCOUNTER — Other Ambulatory Visit: Payer: Self-pay

## 2020-08-15 ENCOUNTER — Telehealth: Payer: Self-pay

## 2020-08-15 MED ORDER — GLIPIZIDE 5 MG PO TABS: 5.0000 mg | ORAL_TABLET | Freq: Two times a day (BID) | ORAL | 0 refills | Status: DC

## 2020-08-15 NOTE — Telephone Encounter (Signed)
Pt called to refill glipizide. Rx sent and pt notified

## 2020-09-02 ENCOUNTER — Other Ambulatory Visit: Payer: Self-pay

## 2020-09-02 DIAGNOSIS — D649 Anemia, unspecified: Secondary | ICD-10-CM

## 2020-09-02 DIAGNOSIS — E119 Type 2 diabetes mellitus without complications: Secondary | ICD-10-CM

## 2020-09-02 DIAGNOSIS — Z13220 Encounter for screening for lipoid disorders: Secondary | ICD-10-CM

## 2020-09-03 LAB — CBC WITH DIFFERENTIAL/PLATELET
Basophils Absolute: 0 10*3/uL (ref 0.0–0.2)
Basos: 0 %
EOS (ABSOLUTE): 0.2 10*3/uL (ref 0.0–0.4)
Eos: 2 %
Hematocrit: 37.5 % (ref 34.0–46.6)
Hemoglobin: 11.3 g/dL (ref 11.1–15.9)
Immature Grans (Abs): 0.1 10*3/uL (ref 0.0–0.1)
Immature Granulocytes: 1 %
Lymphocytes Absolute: 2.5 10*3/uL (ref 0.7–3.1)
Lymphs: 24 %
MCH: 25.3 pg — ABNORMAL LOW (ref 26.6–33.0)
MCHC: 30.1 g/dL — ABNORMAL LOW (ref 31.5–35.7)
MCV: 84 fL (ref 79–97)
Monocytes Absolute: 0.5 10*3/uL (ref 0.1–0.9)
Monocytes: 5 %
Neutrophils Absolute: 7.2 10*3/uL — ABNORMAL HIGH (ref 1.4–7.0)
Neutrophils: 68 %
Platelets: 273 10*3/uL (ref 150–450)
RBC: 4.46 x10E6/uL (ref 3.77–5.28)
RDW: 14.5 % (ref 11.7–15.4)
WBC: 10.6 10*3/uL (ref 3.4–10.8)

## 2020-09-03 LAB — LIPID PANEL W/O CHOL/HDL RATIO
Cholesterol, Total: 200 mg/dL — ABNORMAL HIGH (ref 100–199)
HDL: 57 mg/dL (ref >39)
LDL Chol Calc (NIH): 104 mg/dL — ABNORMAL HIGH (ref 0–99)
Triglycerides: 231 mg/dL — ABNORMAL HIGH (ref 0–149)
VLDL Cholesterol Cal: 39 mg/dL (ref 5–40)

## 2020-09-03 LAB — MICROALBUMIN / CREATININE URINE RATIO
Creatinine, Urine: 183.4 mg/dL
Microalb/Creat Ratio: 82 mg/g creat — ABNORMAL HIGH (ref 0–29)
Microalbumin, Urine: 150.2 ug/mL

## 2020-09-03 LAB — HEMOGLOBIN A1C
Est. average glucose Bld gHb Est-mCnc: 166 mg/dL
Hgb A1c MFr Bld: 7.4 % — ABNORMAL HIGH (ref 4.8–5.6)

## 2020-09-06 ENCOUNTER — Other Ambulatory Visit: Payer: Self-pay

## 2020-09-06 ENCOUNTER — Encounter: Payer: Self-pay | Admitting: Internal Medicine

## 2020-09-06 ENCOUNTER — Ambulatory Visit: Payer: Self-pay | Admitting: Internal Medicine

## 2020-09-06 VITALS — BP 124/84 | HR 68 | Resp 14 | Ht 62.0 in | Wt 202.0 lb

## 2020-09-06 DIAGNOSIS — E782 Mixed hyperlipidemia: Secondary | ICD-10-CM

## 2020-09-06 DIAGNOSIS — D500 Iron deficiency anemia secondary to blood loss (chronic): Secondary | ICD-10-CM

## 2020-09-06 DIAGNOSIS — R809 Proteinuria, unspecified: Secondary | ICD-10-CM

## 2020-09-06 DIAGNOSIS — E669 Obesity, unspecified: Secondary | ICD-10-CM

## 2020-09-06 DIAGNOSIS — E119 Type 2 diabetes mellitus without complications: Secondary | ICD-10-CM

## 2020-09-06 LAB — GLUCOSE, POCT (MANUAL RESULT ENTRY): POC Glucose: 251 mg/dl — AB (ref 70–99)

## 2020-09-06 MED ORDER — GLIPIZIDE 10 MG PO TABS: ORAL_TABLET | ORAL | 3 refills | Status: DC

## 2020-09-06 MED ORDER — SIMVASTATIN 20 MG PO TABS: ORAL_TABLET | ORAL | 11 refills | Status: DC

## 2020-09-06 MED ORDER — LISINOPRIL 5 MG PO TABS: ORAL_TABLET | ORAL | 11 refills | Status: DC

## 2020-09-06 MED ORDER — METFORMIN HCL ER 500 MG PO TB24: ORAL_TABLET | ORAL | 0 refills | Status: DC

## 2020-09-06 NOTE — Progress Notes (Unsigned)
Subjective:    Patient ID: Joanne Roberts, female   DOB: [birth year], 40 y.o.   MRN: [id number]   HPI   DM:  Has been going to park walking and going to gym.  She also has her children being more active with sports, etc.  She has been taking Glipizide 10 mg twice daily some days (not clear how many times per month) and Metformin 1000 mg twice daily.  She elected to increase Glipizide to 10 mg twice daily after visit in May.    2.  Hyperlipidemia:  As above--working as a family on lifestyle.  She and husband or consistent with condoms each time they have sex.    3.  Iron Deficiency Anemia:  much improved  4.  Microalbuminuria:  mildly improved.    Current Meds  Medication Sig   Blood Glucose Monitoring Suppl (ACCU-CHEK NANO SMARTVIEW) w/Device KIT 1 kit by Subdermal route as directed. Check blood sugars for fasting, and two hours after breakfast, lunch and dinner (4 checks daily)   ferrous gluconate (FERGON) 324 MG tablet Take 1 tablet (324 mg total) by mouth daily with breakfast.   glipiZIDE (GLUCOTROL) 5 MG tablet Take 1 tablet (5 mg total) by mouth 2 (two) times daily before a meal.   glucose blood (TRUETRACK TEST) test strip Check blood sugars twice daily before meals   Lancets Thin MISC Check blood glucose twice daily   metFORMIN (GLUCOPHAGE-XR) 500 MG 24 hr tablet 1 tab by mouth twice daily for 7 days, then 2 tabs twice daily with meals thereafter.   No Known Allergies   Review of Systems    Objective:   BP 124/84 (BP Location: Right Arm, Patient Position: Sitting, Cuff Size: Normal)   Pulse 68   Resp 14   Ht 5' 2" (1.575 m)   Wt 202 lb (91.6 kg)   BMI 36.95 kg/m   Physical Exam NAD Lungs:  CTA CV:  RRR without murmur or rub.  Radial and DP pulses normal and equal LE:  No edema.  Assessment & Plan    DM:  not quite at goal.  Definitive change of Glipizide 10 mg twice daily   2.  Hyperlipidemia:  no change with lifestyle changes.  States they are  consistent with condoms.  Simvastatin 20 mg daily with repeat FLP/hepatic profile in 6 weeks.  3.  Microalbuminuria:  Lisinopril 5 mg daily.  BP check with BMP in 2 weeks.  4.  Anemia:  much improved.  Still with heavy periods, so continue twice daily Ferrous gluconate.

## 2020-09-20 ENCOUNTER — Other Ambulatory Visit: Payer: Self-pay

## 2020-09-20 VITALS — BP 122/68

## 2020-09-20 DIAGNOSIS — R809 Proteinuria, unspecified: Secondary | ICD-10-CM

## 2020-09-20 DIAGNOSIS — Z79899 Other long term (current) drug therapy: Secondary | ICD-10-CM

## 2020-09-20 DIAGNOSIS — Z0130 Encounter for examination of blood pressure without abnormal findings: Secondary | ICD-10-CM

## 2020-09-20 NOTE — Progress Notes (Signed)
Patient had no questions or concerns

## 2020-09-21 LAB — BASIC METABOLIC PANEL
BUN/Creatinine Ratio: 25 — ABNORMAL HIGH (ref 9–23)
BUN: 13 mg/dL (ref 6–20)
CO2: 20 mmol/L (ref 20–29)
Calcium: 9.1 mg/dL (ref 8.7–10.2)
Chloride: 104 mmol/L (ref 96–106)
Creatinine, Ser: 0.51 mg/dL — ABNORMAL LOW (ref 0.57–1.00)
Glucose: 137 mg/dL — ABNORMAL HIGH (ref 65–99)
Potassium: 4.4 mmol/L (ref 3.5–5.2)
Sodium: 140 mmol/L (ref 134–144)
eGFR: 122 mL/min/{1.73_m2} (ref >59)

## 2020-10-01 NOTE — Progress Notes (Signed)
BP fine on low dose Lisinopril for microalbuminuria.

## 2020-10-18 ENCOUNTER — Telehealth: Payer: Self-pay

## 2020-10-18 ENCOUNTER — Other Ambulatory Visit: Payer: Self-pay

## 2020-10-18 DIAGNOSIS — R058 Other specified cough: Secondary | ICD-10-CM

## 2020-10-18 DIAGNOSIS — Z79899 Other long term (current) drug therapy: Secondary | ICD-10-CM

## 2020-10-18 DIAGNOSIS — E782 Mixed hyperlipidemia: Secondary | ICD-10-CM

## 2020-10-18 NOTE — Telephone Encounter (Signed)
After 2 weeks after starting new medications simvastatin and lisinopril pt has developed a dry cough. Unsure of which medication is causing it. Cough occurs mainly at night.

## 2020-10-19 LAB — LIPID PANEL W/O CHOL/HDL RATIO
Cholesterol, Total: 153 mg/dL (ref 100–199)
HDL: 57 mg/dL (ref >39)
LDL Chol Calc (NIH): 68 mg/dL (ref 0–99)
Triglycerides: 167 mg/dL — ABNORMAL HIGH (ref 0–149)
VLDL Cholesterol Cal: 28 mg/dL (ref 5–40)

## 2020-10-19 LAB — HEPATIC FUNCTION PANEL
ALT: 49 IU/L — ABNORMAL HIGH (ref 0–32)
AST: 40 IU/L (ref 0–40)
Albumin: 4.1 g/dL (ref 3.8–4.8)
Alkaline Phosphatase: 123 IU/L — ABNORMAL HIGH (ref 44–121)
Bilirubin Total: 0.3 mg/dL (ref 0.0–1.2)
Bilirubin, Direct: 0.11 mg/dL (ref 0.00–0.40)
Total Protein: 7.6 g/dL (ref 6.0–8.5)

## 2020-10-23 DIAGNOSIS — T464X5A Adverse effect of angiotensin-converting-enzyme inhibitors, initial encounter: Secondary | ICD-10-CM | POA: Insufficient documentation

## 2020-10-23 DIAGNOSIS — R058 Other specified cough: Secondary | ICD-10-CM | POA: Insufficient documentation

## 2020-10-23 MED ORDER — LOSARTAN POTASSIUM 50 MG PO TABS: 50.0000 mg | ORAL_TABLET | Freq: Every day | ORAL | 11 refills | Status: DC

## 2020-10-23 NOTE — Telephone Encounter (Signed)
Pt.notified

## 2021-01-07 ENCOUNTER — Other Ambulatory Visit: Payer: Self-pay

## 2021-01-10 ENCOUNTER — Encounter: Payer: Self-pay | Admitting: Internal Medicine

## 2021-02-17 ENCOUNTER — Telehealth: Payer: Self-pay

## 2021-02-17 MED ORDER — FLUCONAZOLE 150 MG PO TABS: 150.0000 mg | ORAL_TABLET | Freq: Once | ORAL | 0 refills | Status: AC

## 2021-02-17 NOTE — Telephone Encounter (Signed)
Patient would like Rx for yeast infection. Patient is experiencing itchy in genital area and white discharge since 02/15/21. Believes it is due to her high sugar levels. Admitted to not taking medication consistently and her glucose level reaching 346 when she checked it over the weekend.   [facility] is her preferred pharmacy

## 2021-02-18 NOTE — Telephone Encounter (Signed)
Rx sent to pharmacy. Patient was notified.  

## 2021-04-04 ENCOUNTER — Other Ambulatory Visit: Payer: Self-pay

## 2021-04-08 ENCOUNTER — Other Ambulatory Visit: Payer: Self-pay

## 2021-04-08 DIAGNOSIS — E119 Type 2 diabetes mellitus without complications: Secondary | ICD-10-CM

## 2021-04-09 LAB — HEMOGLOBIN A1C
Est. average glucose Bld gHb Est-mCnc: 189 mg/dL
Hgb A1c MFr Bld: 8.2 % — ABNORMAL HIGH (ref 4.8–5.6)

## 2021-04-11 ENCOUNTER — Ambulatory Visit: Payer: Self-pay | Admitting: Internal Medicine

## 2021-04-11 ENCOUNTER — Other Ambulatory Visit: Payer: Self-pay | Admitting: Internal Medicine

## 2021-04-11 ENCOUNTER — Encounter: Payer: Self-pay | Admitting: Internal Medicine

## 2021-04-11 VITALS — BP 112/86 | HR 72 | Resp 12 | Ht 62.25 in | Wt 204.0 lb

## 2021-04-11 DIAGNOSIS — R809 Proteinuria, unspecified: Secondary | ICD-10-CM

## 2021-04-11 DIAGNOSIS — Z5919 Other inadequate housing: Secondary | ICD-10-CM

## 2021-04-11 DIAGNOSIS — Z5941 Food insecurity: Secondary | ICD-10-CM

## 2021-04-11 DIAGNOSIS — E119 Type 2 diabetes mellitus without complications: Secondary | ICD-10-CM

## 2021-04-11 DIAGNOSIS — Z124 Encounter for screening for malignant neoplasm of cervix: Secondary | ICD-10-CM

## 2021-04-11 DIAGNOSIS — E782 Mixed hyperlipidemia: Secondary | ICD-10-CM

## 2021-04-11 DIAGNOSIS — D500 Iron deficiency anemia secondary to blood loss (chronic): Secondary | ICD-10-CM

## 2021-04-11 DIAGNOSIS — E669 Obesity, unspecified: Secondary | ICD-10-CM

## 2021-04-11 DIAGNOSIS — Z23 Encounter for immunization: Secondary | ICD-10-CM

## 2021-04-11 DIAGNOSIS — Z0000 Encounter for general adult medical examination without abnormal findings: Secondary | ICD-10-CM

## 2021-04-11 NOTE — Progress Notes (Unsigned)
? ? ?Subjective:  ?  ?Patient ID: Joanne Roberts, female   DOB: [birth year], 41 y.o.   MRN: [id number] ? ? ?HPI ? ?CPE with pap ? ?1.  Pap:  Last performed 09/2016 and normal. ? ?2.  Mammogram:  Never.  No family history of breast cancer.   ? ?3.  Osteoprevention:  drinks 1 cup daily.  Willing to drink 3-4 cups daily.  Not physically active.   ? ?4.  Guaiac Cards/FIT:  Never.   ? ?5.  Colonoscopy:  Never.  No family history of colon cancer.   ? ?6.  Immunizations:  did not get influenza vaccine this past season. ?Immunization History  ?Administered Date(s) Administered  ? Influenza,inj,Quad PF,6+ Mos 11/19/2016  ? PFIZER(Purple Top)SARS-COV-2 Vaccination 07/07/2019, 07/28/2019, 03/18/2020  ? Pneumococcal Polysaccharide-23 08/18/2019  ? Tdap 07/30/2011, 10/29/2016  ? ? ? ?7.  Glucose/Cholesterol :  A1C 8.2%.  Was feeling well and stopped taking meds for diabetes.  Also ate poorly over holidays.  Not physically active.   ? ?Current Meds  ?Medication Sig  ? Blood Glucose Monitoring Suppl (ACCU-CHEK NANO SMARTVIEW) w/Device KIT 1 kit by Subdermal route as directed. Check blood sugars for fasting, and two hours after breakfast, lunch and dinner (4 checks daily)  ? glipiZIDE (GLUCOTROL) 10 MG tablet 1 tab by mouth twice daily with meal  ? glucose blood (TRUETRACK TEST) test strip Check blood sugars twice daily before meals  ? Lancets Thin MISC Check blood glucose twice daily  ? losartan (COZAAR) 50 MG tablet Take 1 tablet (50 mg total) by mouth daily.  ? metFORMIN (GLUCOPHAGE-XR) 500 MG 24 hr tablet 2 tabs by mouth twice daily with meals  ? simvastatin (ZOCOR) 20 MG tablet 1 tab by mouth with evening meal  ? ?Allergies  ?Allergen Reactions  ? Lisinopril Cough  ?  ACE I cough  ? ?Past Medical History:  ?Diagnosis Date  ? Anemia   ? DM type 2 (diabetes mellitus, type 2) ([place])   ? Gestational diabetes   ? insulin  ? Gestational diabetes mellitus (GDM), antepartum 10/08/2016  ? Current Diabetic Medications:  Regular 16  units before breakfast, 20 before dinner NPH 45 units before breakfast, 18 units at bedtime [NA] Aspirin 81 mg daily after 12 weeks; discontinue after 36 weeks (? A2/B GDM)  Required Referrals for A1GDM or A2GDM: _0  Diabetes Education and Testing Supplies _1  Nutrition Cousult  Baseline and surveillance labs (pulled in from EPIC, refresh links as needed)   ? Microalbuminuria   ? ?Past Surgical History:  ?Procedure Laterality Date  ? NO PAST SURGERIES    ? ?Family History  ?Problem Relation Age of Onset  ? Obesity Mother   ? Diabetes Mother   ? Alcohol abuse Father   ? Diabetes Father   ? Diabetes Brother   ? Allergies Son   ? ?Social History  ? ?Socioeconomic History  ? Marital status: Married  ?  Spouse name: Not on file  ? Number of children: 4  ? Years of education: 85  ? Highest education level: Some college, no degree  ?Occupational History  ? 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 Smoking status: Never  ? Smokeless tobacco: Never  ?Vaping Use  ? Vaping Use: Never used  ?Substance and Sexual Activity  ? Alcohol use: Yes  ?  Comment: once or twice a month--2 beers or glass of wine  ? Drug use: No  ? Sexual activity: Yes  ?  Birth control/protection:  Condom  ?Other Topics Concern  ? Not on file  ?Social History Narrative  ? Lives at home with husband and 4 children.  ? ?Social Determinants of Health  ? ?Financial Resource Strain: Medium Risk  ? Difficulty of Paying Living Expenses: Somewhat hard  ?Food Insecurity: Food Insecurity Present  ? Worried About Charity fundraiser in the Last Year: Sometimes true  ? Ran Out of Food in the Last Year: Sometimes true  ?Transportation Needs: No Transportation Needs  ? Lack of Transportation (Medical): No  ? Lack of Transportation (Non-Medical): No  ?Physical Activity: Not on file  ?Stress: Not on file  ?Social Connections: Not on file  ?Intimate Partner Violence: Not At Risk  ? Fear of Current or Ex-Partner: No  ? Emotionally Abused: No  ? Physically Abused: No  ?  Sexually Abused: No  ? ? ? ?Review of Systems  ?Eyes:  Positive for visual disturbance (No eye check in past year).  ?Respiratory:  Negative for shortness of breath.   ?Cardiovascular:  Negative for chest pain, palpitations and leg swelling.  ?Neurological:  Negative for weakness and numbness.  ?Psychiatric/Behavioral:  Negative for dysphoric mood. The patient is not nervous/anxious.   ? ? ? ?Objective:  ? ?BP 112/86 (BP Location: Left Arm, Patient Position: Sitting, Cuff Size: Normal)   Pulse 72   Resp 12   Ht 5' 2.25" (1.581 m)   Wt 204 lb (92.5 kg)   LMP 03/16/2021 (Within Days)   BMI 37.01 kg/m?  ? ?Physical Exam ? ?Cervix facing toward posterior wall vaginal canal.  No obvious lesions.  Vulva with chronic moisture appearance--white, wrinkly skin ?Assessment & Plan  ? ?Urinary stress incontinence--wears pad  all the time.  Will think about gyn referral   No consistent with kegels ? ?Will think about [facility] referral for mold, insect ? ?

## 2021-04-11 NOTE — Patient Instructions (Signed)

## 2021-04-13 LAB — MICROALBUMIN / CREATININE URINE RATIO
Creatinine, Urine: 95.5 mg/dL
Microalb/Creat Ratio: 73 mg/g creat — ABNORMAL HIGH (ref 0–29)
Microalbumin, Urine: 69.3 ug/mL

## 2021-04-14 LAB — CYTOLOGY - PAP

## 2021-04-28 MED ORDER — METFORMIN HCL ER 500 MG PO TB24: ORAL_TABLET | ORAL | 3 refills | Status: DC

## 2021-04-28 NOTE — Addendum Note (Signed)
Addended by: Marcelino Duster on: 04/28/2021 11:12 PM ? ? Modules accepted: Orders ? ?

## 2021-07-09 ENCOUNTER — Other Ambulatory Visit: Payer: Self-pay | Admitting: Internal Medicine

## 2021-07-09 DIAGNOSIS — E119 Type 2 diabetes mellitus without complications: Secondary | ICD-10-CM

## 2021-07-09 DIAGNOSIS — D500 Iron deficiency anemia secondary to blood loss (chronic): Secondary | ICD-10-CM

## 2021-07-09 DIAGNOSIS — Z1159 Encounter for screening for other viral diseases: Secondary | ICD-10-CM

## 2021-07-10 LAB — CBC WITH DIFFERENTIAL/PLATELET
Basophils Absolute: 0.1 10*3/uL (ref 0.0–0.2)
Basos: 1 %
EOS (ABSOLUTE): 0.3 10*3/uL (ref 0.0–0.4)
Eos: 3 %
Hematocrit: 33.1 % — ABNORMAL LOW (ref 34.0–46.6)
Hemoglobin: 10 g/dL — ABNORMAL LOW (ref 11.1–15.9)
Immature Grans (Abs): 0 10*3/uL (ref 0.0–0.1)
Immature Granulocytes: 0 %
Lymphocytes Absolute: 3 10*3/uL (ref 0.7–3.1)
Lymphs: 30 %
MCH: 22.6 pg — ABNORMAL LOW (ref 26.6–33.0)
MCHC: 30.2 g/dL — ABNORMAL LOW (ref 31.5–35.7)
MCV: 75 fL — ABNORMAL LOW (ref 79–97)
Monocytes Absolute: 0.5 10*3/uL (ref 0.1–0.9)
Monocytes: 5 %
Neutrophils Absolute: 6.2 10*3/uL (ref 1.4–7.0)
Neutrophils: 61 %
Platelets: 312 10*3/uL (ref 150–450)
RBC: 4.43 x10E6/uL (ref 3.77–5.28)
RDW: 18.1 % — ABNORMAL HIGH (ref 11.7–15.4)
WBC: 10.2 10*3/uL (ref 3.4–10.8)

## 2021-07-10 LAB — HEPATITIS C ANTIBODY: Hep C Virus Ab: NONREACTIVE

## 2021-07-10 LAB — HEMOGLOBIN A1C
Est. average glucose Bld gHb Est-mCnc: 180 mg/dL
Hgb A1c MFr Bld: 7.9 % — ABNORMAL HIGH (ref 4.8–5.6)

## 2021-07-11 ENCOUNTER — Ambulatory Visit: Payer: Self-pay | Admitting: Internal Medicine

## 2021-07-11 ENCOUNTER — Encounter: Payer: Self-pay | Admitting: Internal Medicine

## 2021-07-11 VITALS — BP 112/70 | HR 68 | Resp 16 | Ht 62.25 in | Wt 201.0 lb

## 2021-07-11 DIAGNOSIS — N920 Excessive and frequent menstruation with regular cycle: Secondary | ICD-10-CM

## 2021-07-11 DIAGNOSIS — E119 Type 2 diabetes mellitus without complications: Secondary | ICD-10-CM

## 2021-07-11 DIAGNOSIS — D500 Iron deficiency anemia secondary to blood loss (chronic): Secondary | ICD-10-CM

## 2021-07-11 DIAGNOSIS — M25511 Pain in right shoulder: Secondary | ICD-10-CM

## 2021-07-11 MED ORDER — NORETHINDRONE 0.35 MG PO TABS: 1.0000 | ORAL_TABLET | Freq: Every day | ORAL | 11 refills | Status: DC

## 2021-07-11 MED ORDER — IBUPROFEN 200 MG PO TABS: ORAL_TABLET | ORAL | 0 refills | Status: AC

## 2021-07-11 NOTE — Progress Notes (Unsigned)
    Subjective:    Patient ID: Joanne Roberts, female   DOB: October 25, 1980, 41 y.o.   MRN: [id number]   HPI   DM:  A1C down to 7.9% from 8.2% 3 months ago.  Stopped drinking soda and eating so many tortillas.  Walking more--daily for 30 minutes.  She is willing to increase to 60 minutes by 5 min increments each week.    2.  Microcytic anemia:  anemia has returned--has heavy periods still.  Last 5 days and heavy.  Periods are regular and +dysmenorrhea.  She is interested in considering BCPs to control.    3.  Right shoulder pain:  was pulled by a friend's dog by collar and fell forward on her chest.  Hurts to abduct fully and internal rotation.  Current Meds  Medication Sig   Blood Glucose Monitoring Suppl (ACCU-CHEK NANO SMARTVIEW) w/Device KIT 1 kit by Subdermal route as directed. Check blood sugars for fasting, and two hours after breakfast, lunch and dinner (4 checks daily)   ferrous gluconate (FERGON) 324 MG tablet Take 1 tablet (324 mg total) by mouth daily with breakfast.   glipiZIDE (GLUCOTROL) 10 MG tablet 1 tab by mouth twice daily with meal   glucose blood (TRUETRACK TEST) test strip Check blood sugars twice daily before meals   Lancets Thin MISC Check blood glucose twice daily   losartan (COZAAR) 50 MG tablet Take 1 tablet (50 mg total) by mouth daily.   metFORMIN (GLUCOPHAGE-XR) 500 MG 24 hr tablet 2 tabs by mouth twice daily with meals   simvastatin (ZOCOR) 20 MG tablet 1 tab by mouth with evening meal   Allergies  Allergen Reactions   Lisinopril Cough    ACE I cough     Review of Systems    Objective:   BP 112/70 (BP Location: Right Arm, Patient Position: Sitting, Cuff Size: Normal)   Pulse 68   Resp 16   Ht 5' 2.25" (1.581 m)   Wt 201 lb (91.2 kg)   LMP 06/24/2021 (Exact Date)   BMI 36.47 kg/m   Physical Exam   Assessment & Plan   Fatigue:  add TSH to labs in 3 mnths.

## 2021-07-11 NOTE — Patient Instructions (Signed)
Make goal to start walking in mornings daily with neighbor--start low and gradually increase

## 2021-08-01 ENCOUNTER — Other Ambulatory Visit: Payer: Self-pay | Admitting: Internal Medicine

## 2021-08-10 ENCOUNTER — Encounter: Payer: Self-pay | Admitting: Internal Medicine

## 2021-10-06 ENCOUNTER — Other Ambulatory Visit: Payer: Self-pay

## 2021-10-07 ENCOUNTER — Other Ambulatory Visit: Payer: Self-pay

## 2021-10-07 DIAGNOSIS — E119 Type 2 diabetes mellitus without complications: Secondary | ICD-10-CM

## 2021-10-08 LAB — HEMOGLOBIN A1C
Est. average glucose Bld gHb Est-mCnc: 220 mg/dL
Hgb A1c MFr Bld: 9.3 % — ABNORMAL HIGH (ref 4.8–5.6)

## 2021-10-10 ENCOUNTER — Ambulatory Visit: Payer: Self-pay | Admitting: Internal Medicine

## 2021-10-10 ENCOUNTER — Encounter: Payer: Self-pay | Admitting: Internal Medicine

## 2021-10-10 VITALS — BP 100/66 | HR 64 | Resp 16 | Ht 62.25 in | Wt 202.0 lb

## 2021-10-10 DIAGNOSIS — D500 Iron deficiency anemia secondary to blood loss (chronic): Secondary | ICD-10-CM

## 2021-10-10 DIAGNOSIS — E782 Mixed hyperlipidemia: Secondary | ICD-10-CM

## 2021-10-10 DIAGNOSIS — R5383 Other fatigue: Secondary | ICD-10-CM

## 2021-10-10 DIAGNOSIS — N920 Excessive and frequent menstruation with regular cycle: Secondary | ICD-10-CM

## 2021-10-10 DIAGNOSIS — E119 Type 2 diabetes mellitus without complications: Secondary | ICD-10-CM

## 2021-10-10 DIAGNOSIS — Z23 Encounter for immunization: Secondary | ICD-10-CM

## 2021-10-10 MED ORDER — AGAMATRIX PRESTO W/DEVICE KIT: PACK | 0 refills | Status: AC

## 2021-10-10 MED ORDER — AGAMATRIX ULTRA-THIN LANCETS MISC: 11 refills | Status: DC

## 2021-10-10 MED ORDER — LOSARTAN POTASSIUM 50 MG PO TABS: 50.0000 mg | ORAL_TABLET | Freq: Every day | ORAL | 3 refills | Status: DC

## 2021-10-10 MED ORDER — AGAMATRIX PRESTO TEST VI STRP
ORAL_STRIP | 11 refills | Status: DC
Start: 2021-10-10 — End: 2023-02-17

## 2021-10-10 MED ORDER — SIMVASTATIN 20 MG PO TABS: ORAL_TABLET | ORAL | 3 refills | Status: DC

## 2021-10-10 NOTE — Progress Notes (Signed)
Subjective:    Patient ID: Joanne Roberts, female   DOB: 02-21-1980, 41 y.o.   MRN: [id number]   HPI   DM:  A1C has climbed to 9.3%.  She is not checking sugars.  She feels she has made significant changes to diet.  Is physically active for 30-60 minutes daily with different sports or walking.  Does admit she ultimately does not get up until 10 a.m. and does not go to bed until 2 a.m. as knitting.  Her 69 yo son also stays up late with her and sleeps on a couch in her bedroom.  Husband in bed at midnight.  Has not heard from eye or dental referrals.    2.  Anemia/Menorrhagia:  Spots for 2-3 days, but then much less heavier flow for 3 days, then spots again for 3-4 days.  Still with some clotting, but much less.  Now using regular pads for 6 hours.  Previously, had to change pad every hour or less. Previously with 7 days of heavy bleeding.  Her periods have been this way since starting norethindrone in July.  States still taking Ferrous gluconate twice daily.  3.  Hyperlipidemia:  Was basically at goal in Oct of 2022.  Taking Simvastatin daily.  Current Meds  Medication Sig   ferrous gluconate (FERGON) 324 MG tablet Take 1 tablet (324 mg total) by mouth daily with breakfast.   glipiZIDE (GLUCOTROL) 10 MG tablet TAKE 1 TABLET BY MOUTH TWICE DAILY WITH MEAL.   losartan (COZAAR) 50 MG tablet Take 1 tablet (50 mg total) by mouth daily.   metFORMIN (GLUCOPHAGE-XR) 500 MG 24 hr tablet 2 tabs by mouth twice daily with meals   norethindrone (ORTHO MICRONOR) 0.35 MG tablet Take 1 tablet (0.35 mg total) by mouth daily.   simvastatin (ZOCOR) 20 MG tablet 1 tab by mouth with evening meal   Allergies  Allergen Reactions   Lisinopril Cough    ACE I cough     Review of Systems    Objective:   BP 100/66 (BP Location: Right Arm, Patient Position: Sitting, Cuff Size: Normal)   Pulse 64   Resp 16   Ht 5' 2.25" (1.581 m)   Wt 202 lb (91.6 kg)   LMP 09/28/2021 (Exact Date)   BMI 36.65  kg/m   Physical Exam NAD HEENT:  PERRL, EOMI Neck:  Supple, No adenopathy, no thyromegaly Chest:  RRR without murmur or rub.  Radial and DP pulses normal and equal Abd:  S, NT, No HSM or mass, + BS LE:  No edema.   Assessment & Plan    DM:  has improved diet and physical activity, but sleep wake cycle unhealthy for both her and 53 yo son.  Encouraged her to get to bed by 11 p.m. and up by 8 a.m..  Son needs to get in a better pattern for school as well and learn to sleep in his own bed.  Encouraged her to get started on those changes for both of them.  Repeat A1C in 3 months.  Encouraged checking blood glucose twice daily before meals to get a better idea of what increases her blood sugars and help with lifestyle changes. Checking on eye referral.  2.  Menorrhagia with anemia:  CBC.  Continue micronor.  Improved by history.  Continue iron.  3.  Fatigue:  likely due to odd sleep/wake cycle and previous anemia:  CBC, CMP, TSH to further evaluate.    4.  Hyperlipidemia:  FLP  5. HM:  influenza vaccine  6.  Checking on dental referral.

## 2021-10-11 LAB — CBC WITH DIFFERENTIAL/PLATELET
Basophils Absolute: 0 10*3/uL (ref 0.0–0.2)
Basos: 0 %
EOS (ABSOLUTE): 0.2 10*3/uL (ref 0.0–0.4)
Eos: 2 %
Hematocrit: 35.4 % (ref 34.0–46.6)
Hemoglobin: 11.4 g/dL (ref 11.1–15.9)
Immature Grans (Abs): 0 10*3/uL (ref 0.0–0.1)
Immature Granulocytes: 0 %
Lymphocytes Absolute: 2.5 10*3/uL (ref 0.7–3.1)
Lymphs: 27 %
MCH: 26.3 pg — ABNORMAL LOW (ref 26.6–33.0)
MCHC: 32.2 g/dL (ref 31.5–35.7)
MCV: 82 fL (ref 79–97)
Monocytes Absolute: 0.4 10*3/uL (ref 0.1–0.9)
Monocytes: 4 %
Neutrophils Absolute: 6.1 10*3/uL (ref 1.4–7.0)
Neutrophils: 67 %
Platelets: 283 10*3/uL (ref 150–450)
RBC: 4.34 x10E6/uL (ref 3.77–5.28)
RDW: 15 % (ref 11.7–15.4)
WBC: 9.2 10*3/uL (ref 3.4–10.8)

## 2021-10-11 LAB — LIPID PANEL W/O CHOL/HDL RATIO
Cholesterol, Total: 134 mg/dL (ref 100–199)
HDL: 51 mg/dL (ref >39)
LDL Chol Calc (NIH): 55 mg/dL (ref 0–99)
Triglycerides: 166 mg/dL — ABNORMAL HIGH (ref 0–149)
VLDL Cholesterol Cal: 28 mg/dL (ref 5–40)

## 2021-10-11 LAB — TSH: TSH: 3.41 u[IU]/mL (ref 0.450–4.500)

## 2021-10-17 ENCOUNTER — Ambulatory Visit: Payer: Self-pay | Admitting: Internal Medicine

## 2022-01-08 ENCOUNTER — Other Ambulatory Visit: Payer: Self-pay

## 2022-01-14 ENCOUNTER — Encounter: Payer: Self-pay | Admitting: Internal Medicine

## 2022-02-25 ENCOUNTER — Telehealth: Payer: Self-pay

## 2022-02-25 NOTE — Telephone Encounter (Signed)
Patient would like rx for yeast infection. Started 3 day ago. Patient has itching and discharge. Patient believes that her bad diabetic control is the cause.

## 2022-02-26 ENCOUNTER — Ambulatory Visit (INDEPENDENT_AMBULATORY_CARE_PROVIDER_SITE_OTHER): Payer: Self-pay | Admitting: Internal Medicine

## 2022-02-26 DIAGNOSIS — N898 Other specified noninflammatory disorders of vagina: Secondary | ICD-10-CM

## 2022-02-26 LAB — POCT WET PREP WITH KOH
KOH Prep POC: NEGATIVE
RBC Wet Prep HPF POC: NEGATIVE
Trichomonas, UA: NEGATIVE

## 2022-02-26 MED ORDER — FLUCONAZOLE 150 MG PO TABS: ORAL_TABLET | ORAL | 0 refills | Status: DC

## 2022-02-26 MED ORDER — METRONIDAZOLE 500 MG PO TABS: 500.0000 mg | ORAL_TABLET | Freq: Two times a day (BID) | ORAL | 0 refills | Status: AC

## 2022-02-26 NOTE — Telephone Encounter (Signed)
Patient was asked to come in to do a self swab

## 2022-02-26 NOTE — Progress Notes (Signed)
External vaginal burning and itching Started taking Azo with perhaps some mild improvement Sugars above 200 No odor, but has had a bit of discharge--yellow Wears a kotex pantiliner regularly as at times has urinary leak.  Wet prep + for clue cells and strong whiff. Metronidazole 500 mg twice daily for 7 days. Fluconazole 150 mg for 2 days to follow if itching persists.   Can use topical miconazole 7 if itching worsens during Metronidazole treatment, but save fluconazole for as needed treatment after completes abx.

## 2022-04-15 ENCOUNTER — Encounter: Payer: Self-pay | Admitting: Internal Medicine

## 2022-04-17 ENCOUNTER — Encounter: Payer: Self-pay | Admitting: Internal Medicine

## 2022-04-29 ENCOUNTER — Other Ambulatory Visit: Payer: Self-pay | Admitting: Internal Medicine

## 2022-06-17 ENCOUNTER — Encounter: Payer: Self-pay | Admitting: Internal Medicine

## 2022-07-20 ENCOUNTER — Other Ambulatory Visit: Payer: Self-pay | Admitting: Internal Medicine

## 2022-07-21 ENCOUNTER — Other Ambulatory Visit: Payer: Self-pay

## 2022-09-15 ENCOUNTER — Telehealth: Payer: Self-pay

## 2022-09-15 NOTE — Telephone Encounter (Signed)
Patient would like a sooner CPE appointment. Patient is available any day after 9:00 AM.   Will call patient if there is a cancellation.

## 2022-09-17 ENCOUNTER — Encounter: Payer: Self-pay | Admitting: Internal Medicine

## 2022-10-19 ENCOUNTER — Other Ambulatory Visit: Payer: Self-pay | Admitting: Internal Medicine

## 2023-02-17 ENCOUNTER — Encounter: Payer: Self-pay | Admitting: Internal Medicine

## 2023-02-17 ENCOUNTER — Ambulatory Visit: Payer: Self-pay | Admitting: Internal Medicine

## 2023-02-17 VITALS — BP 122/74 | HR 64 | Resp 16 | Ht 62.0 in | Wt 190.0 lb

## 2023-02-17 DIAGNOSIS — Z7251 High risk heterosexual behavior: Secondary | ICD-10-CM

## 2023-02-17 DIAGNOSIS — D500 Iron deficiency anemia secondary to blood loss (chronic): Secondary | ICD-10-CM

## 2023-02-17 DIAGNOSIS — N920 Excessive and frequent menstruation with regular cycle: Secondary | ICD-10-CM | POA: Insufficient documentation

## 2023-02-17 DIAGNOSIS — R6889 Other general symptoms and signs: Secondary | ICD-10-CM | POA: Insufficient documentation

## 2023-02-17 DIAGNOSIS — Z1231 Encounter for screening mammogram for malignant neoplasm of breast: Secondary | ICD-10-CM

## 2023-02-17 DIAGNOSIS — K069 Disorder of gingiva and edentulous alveolar ridge, unspecified: Secondary | ICD-10-CM

## 2023-02-17 DIAGNOSIS — Z0000 Encounter for general adult medical examination without abnormal findings: Secondary | ICD-10-CM

## 2023-02-17 DIAGNOSIS — R079 Chest pain, unspecified: Secondary | ICD-10-CM

## 2023-02-17 DIAGNOSIS — Z630 Problems in relationship with spouse or partner: Secondary | ICD-10-CM | POA: Insufficient documentation

## 2023-02-17 DIAGNOSIS — B9689 Other specified bacterial agents as the cause of diseases classified elsewhere: Secondary | ICD-10-CM

## 2023-02-17 DIAGNOSIS — E782 Mixed hyperlipidemia: Secondary | ICD-10-CM

## 2023-02-17 DIAGNOSIS — E119 Type 2 diabetes mellitus without complications: Secondary | ICD-10-CM

## 2023-02-17 DIAGNOSIS — B3731 Acute candidiasis of vulva and vagina: Secondary | ICD-10-CM

## 2023-02-17 LAB — POCT WET PREP WITH KOH
KOH Prep POC: NEGATIVE
RBC Wet Prep HPF POC: NEGATIVE
Trichomonas, UA: NEGATIVE

## 2023-02-17 MED ORDER — AGAMATRIX PRESTO TEST VI STRP: ORAL_STRIP | 11 refills | Status: AC

## 2023-02-17 MED ORDER — OZEMPIC (0.25 OR 0.5 MG/DOSE) 2 MG/3ML ~~LOC~~ SOPN: PEN_INJECTOR | SUBCUTANEOUS | 11 refills | Status: AC

## 2023-02-17 MED ORDER — AGAMATRIX ULTRA-THIN LANCETS MISC: 11 refills | Status: AC

## 2023-02-17 MED ORDER — METFORMIN HCL ER 500 MG PO TB24: ORAL_TABLET | ORAL | 3 refills | Status: AC

## 2023-02-17 MED ORDER — ASPIRIN 81 MG PO TBEC: 81.0000 mg | DELAYED_RELEASE_TABLET | Freq: Every day | ORAL | Status: AC

## 2023-02-17 MED ORDER — NORETHINDRONE 0.35 MG PO TABS: 1.0000 | ORAL_TABLET | Freq: Every day | ORAL | 3 refills | Status: AC

## 2023-02-17 MED ORDER — EMPAGLIFLOZIN 10 MG PO TABS: 10.0000 mg | ORAL_TABLET | Freq: Every day | ORAL | 11 refills | Status: AC

## 2023-02-17 MED ORDER — ESCITALOPRAM OXALATE 10 MG PO TABS: 10.0000 mg | ORAL_TABLET | Freq: Every day | ORAL | 1 refills | Status: DC

## 2023-02-17 MED ORDER — LOSARTAN POTASSIUM 50 MG PO TABS: 50.0000 mg | ORAL_TABLET | Freq: Every day | ORAL | 3 refills | Status: AC

## 2023-02-17 MED ORDER — GLIPIZIDE 10 MG PO TABS: ORAL_TABLET | ORAL | 3 refills | Status: AC

## 2023-02-17 MED ORDER — METRONIDAZOLE 500 MG PO TABS: 500.0000 mg | ORAL_TABLET | Freq: Two times a day (BID) | ORAL | 0 refills | Status: AC

## 2023-02-17 NOTE — Progress Notes (Unsigned)
 Subjective:    Patient ID: Joanne Roberts, female   DOB: Dec 26, 1980, 43 y.o.   MRN: [id number]   HPI  CPE without pap  1.  Pap:  Normal 03/2021.    2.  Mammogram:  Never.  No family history of breast cancer.    3.  Osteoprevention:  Willing to drink 3-4 servings a day.  Currently with one servings daily.  Walks daily for 20 minutes.  Willing to increase gradually to 60 minutes.    4.  Guaiac Cards/FIT:  Never returned  5.  Colonoscopy:  Never.  No family history of colon cancer.    6.  Immunizations:  Has not had influenza or COVID boosters this year. Immunization History  Administered Date(s) Administered   Influenza,inj,Quad PF,6+ Mos 11/19/2016, 10/10/2021   Moderna Covid-19 Vaccine Bivalent Booster 11yrs & up 04/11/2021   PFIZER(Purple Top)SARS-COV-2 Vaccination 07/07/2019, 07/28/2019, 03/18/2020   Pneumococcal Polysaccharide-23 08/18/2019   Tdap 07/30/2011, 10/29/2016     7.  Glucose/Cholesterol:  Has not been seen in a year.  A1C last 9.3% in 09/2021.  Cholesterol okay in Sept 2023.   Lipid Panel     Component Value Date/Time   CHOL 134 10/10/2021 1106   TRIG 166 (H) 10/10/2021 1106   HDL 51 10/10/2021 1106   LDLCALC 55 10/10/2021 1106   LABVLDL 28 10/10/2021 1106      Current Meds  Medication Sig   glipiZIDE  (GLUCOTROL ) 10 MG tablet TAKE 1 TABLET BY MOUTH TWICE DAILY WITH MEAL.   losartan  (COZAAR ) 50 MG tablet Take 1 tablet by mouth once daily   metFORMIN  (GLUCOPHAGE -XR) 500 MG 24 hr tablet TAKE 2 TABLETS BY MOUTH TWICE DAILY WITH MEALS   simvastatin  (ZOCOR ) 20 MG tablet TAKE 1 TABLET BY MOUTH WITH EVENING MEAL   Allergies  Allergen Reactions   Lisinopril  Cough    ACE I cough     Review of Systems  Constitutional:  Positive for fatigue.  Eyes:  Positive for visual disturbance (Blurry at times, especially when awakens).  Respiratory:  Positive for shortness of breath (with chest discomfort).   Cardiovascular:  Positive for chest pain (upper  sternal area--feels like she has been hit there--like a bruise.  Usually occurs only when gets mad.  Never with walking.  Mad at her husband often as he was cheating on her.  He may have fathered another child.) and palpitations (with chest discomfort.). Negative for leg swelling.  Gastrointestinal:  Negative for abdominal pain and blood in stool (no melena).  Endocrine: Positive for cold intolerance.  Genitourinary:  Positive for pelvic pain (with periods) and vaginal bleeding (heavy periods with clots since ran out of micronor  about 6 months ago.).  Neurological:  Negative for weakness and numbness.  Psychiatric/Behavioral:  Positive for dysphoric mood (Finds herself crying when alone--in the shower.) and suicidal ideas (Not current--was in October.  No definite plan.).       Objective:   BP 122/74 (BP Location: Left Arm, Patient Position: Sitting, Cuff Size: Normal)   Pulse 64   Resp 16   Ht 5' 2 (1.575 m)   Wt 190 lb (86.2 kg)   LMP 01/29/2023 (Exact Date)   BMI 34.75 kg/m   Physical Exam Cardiovascular:     Pulses:          Dorsalis pedis pulses are 2+ on the right side and 2+ on the left side.       Posterior tibial pulses are 2+ on the right side  and 2+ on the left side.  Genitourinary:    Comments: External genitalia with chronic thickening of skin folds, mild chronic inflammation.  + amine odor. On exam wit speculum:   Slight vaginal light yellow disharge No cervical lesion No vaginal canal mucosal lesion. Yellow cream intravaginally--states using 7 day antifungal cream No uterine or adnexal mass or tenderness. Feet:     Right foot:     Protective Sensation: 10 sites tested.  10 sites sensed.     Skin integrity: Skin integrity normal.     Toenail Condition: Right toenails are normal.     Left foot:     Protective Sensation: 10 sites tested.  10 sites sensed.     Skin integrity: Skin integrity normal.     Toenail Condition: Left toenails are normal.       Assessment & Plan   CPE

## 2023-02-19 LAB — CBC WITH DIFFERENTIAL/PLATELET
Basophils Absolute: 0.1 10*3/uL (ref 0.0–0.2)
Basos: 1 %
EOS (ABSOLUTE): 0.3 10*3/uL (ref 0.0–0.4)
Eos: 3 %
Hematocrit: 33 % — ABNORMAL LOW (ref 34.0–46.6)
Hemoglobin: 9.7 g/dL — ABNORMAL LOW (ref 11.1–15.9)
Immature Grans (Abs): 0 10*3/uL (ref 0.0–0.1)
Immature Granulocytes: 0 %
Lymphocytes Absolute: 2.9 10*3/uL (ref 0.7–3.1)
Lymphs: 32 %
MCH: 21.3 pg — ABNORMAL LOW (ref 26.6–33.0)
MCHC: 29.4 g/dL — ABNORMAL LOW (ref 31.5–35.7)
MCV: 73 fL — ABNORMAL LOW (ref 79–97)
Monocytes Absolute: 0.5 10*3/uL (ref 0.1–0.9)
Monocytes: 5 %
Neutrophils Absolute: 5.4 10*3/uL (ref 1.4–7.0)
Neutrophils: 59 %
Platelets: 337 10*3/uL (ref 150–450)
RBC: 4.55 x10E6/uL (ref 3.77–5.28)
RDW: 16.6 % — ABNORMAL HIGH (ref 11.7–15.4)
WBC: 9.1 10*3/uL (ref 3.4–10.8)

## 2023-02-19 LAB — COMPREHENSIVE METABOLIC PANEL
ALT: 19 [IU]/L (ref 0–32)
AST: 16 [IU]/L (ref 0–40)
Albumin: 4 g/dL (ref 3.9–4.9)
Alkaline Phosphatase: 121 [IU]/L (ref 44–121)
BUN/Creatinine Ratio: 20 (ref 9–23)
BUN: 10 mg/dL (ref 6–24)
Bilirubin Total: 0.2 mg/dL (ref 0.0–1.2)
CO2: 19 mmol/L — ABNORMAL LOW (ref 20–29)
Calcium: 9.6 mg/dL (ref 8.7–10.2)
Chloride: 99 mmol/L (ref 96–106)
Creatinine, Ser: 0.51 mg/dL — ABNORMAL LOW (ref 0.57–1.00)
Globulin, Total: 3.5 g/dL (ref 1.5–4.5)
Glucose: 224 mg/dL — ABNORMAL HIGH (ref 70–99)
Potassium: 4 mmol/L (ref 3.5–5.2)
Sodium: 135 mmol/L (ref 134–144)
Total Protein: 7.5 g/dL (ref 6.0–8.5)
eGFR: 119 mL/min/{1.73_m2} (ref >59)

## 2023-02-19 LAB — MICROALBUMIN / CREATININE URINE RATIO
Creatinine, Urine: 58.8 mg/dL
Microalb/Creat Ratio: 62 mg/g{creat} — ABNORMAL HIGH (ref 0–29)
Microalbumin, Urine: 36.6 ug/mL

## 2023-02-19 LAB — TSH: TSH: 4.24 u[IU]/mL (ref 0.450–4.500)

## 2023-02-19 LAB — LIPID PANEL W/O CHOL/HDL RATIO
Cholesterol, Total: 211 mg/dL — ABNORMAL HIGH (ref 100–199)
HDL: 75 mg/dL (ref >39)
LDL Chol Calc (NIH): 107 mg/dL — ABNORMAL HIGH (ref 0–99)
Triglycerides: 172 mg/dL — ABNORMAL HIGH (ref 0–149)
VLDL Cholesterol Cal: 29 mg/dL (ref 5–40)

## 2023-02-19 LAB — RPR QUALITATIVE: RPR Ser Ql: NONREACTIVE

## 2023-02-19 LAB — HIV ANTIBODY (ROUTINE TESTING W REFLEX): HIV Screen 4th Generation wRfx: NONREACTIVE

## 2023-02-19 LAB — HGB A1C W/O EAG: Hgb A1c MFr Bld: 10 % — ABNORMAL HIGH (ref 4.8–5.6)

## 2023-02-21 LAB — GC/CHLAMYDIA PROBE AMP
Chlamydia trachomatis, NAA: NEGATIVE
Neisseria Gonorrhoeae by PCR: NEGATIVE

## 2023-02-25 ENCOUNTER — Ambulatory Visit: Payer: Self-pay | Admitting: Internal Medicine

## 2023-02-25 NOTE — Telephone Encounter (Signed)
Patient has been seen.

## 2023-03-01 ENCOUNTER — Telehealth: Payer: Self-pay

## 2023-03-01 NOTE — Telephone Encounter (Signed)
Patient called to report that she is experiencing Dizziness, headache, nausea, and lack of concentration 30 mins after taking lexapro. Side effect started on the same day she started medication last week. It happens every time she takes medication. Wants to know if she should stop taking Lexapro.

## 2023-03-01 NOTE — Telephone Encounter (Signed)
Have her cut the tab in half and take just 5 mg daily for a couple of weeks and then we will see if she can increase to the whole tab

## 2023-03-02 NOTE — Telephone Encounter (Signed)
Patient has been instructed to take 5mg  of Lexapro daily.

## 2023-03-08 ENCOUNTER — Ambulatory Visit: Payer: Self-pay | Admitting: Internal Medicine

## 2023-03-08 MED ORDER — SERTRALINE HCL 50 MG PO TABS: ORAL_TABLET | ORAL | 3 refills | Status: AC

## 2023-03-08 NOTE — Addendum Note (Signed)
 Addended by: Marcene Duos on: 03/08/2023 03:27 PM   Modules accepted: Orders

## 2023-03-10 NOTE — Telephone Encounter (Signed)
 Patient has decided not to try Zoloft. Patient is currently having car problems and cannot afford to buy another medication.

## 2023-03-11 NOTE — Telephone Encounter (Signed)
 Patient told to call us once she is interested in try a new medication.

## 2023-03-11 NOTE — Telephone Encounter (Signed)
 Okay.  Have her notify when she is interested in trying medication again.  It is only $6 at [facility]

## 2023-03-29 ENCOUNTER — Encounter: Payer: Self-pay | Admitting: Internal Medicine

## 2023-04-01 ENCOUNTER — Ambulatory Visit: Payer: Self-pay | Admitting: Internal Medicine

## 2023-08-13 ENCOUNTER — Telehealth: Payer: Self-pay | Admitting: Internal Medicine

## 2023-08-13 NOTE — Telephone Encounter (Signed)
 Patient called and requested refills for medication   simvastatin  (ZOCOR ) 20 MG tablet [[id number]]

## 2023-08-16 ENCOUNTER — Other Ambulatory Visit: Payer: Self-pay

## 2023-08-16 NOTE — Telephone Encounter (Signed)
 Placed call to pharmacy for refill. Pt notified.

## 2023-08-17 ENCOUNTER — Ambulatory Visit: Payer: Self-pay | Admitting: Internal Medicine

## 2023-09-26 ENCOUNTER — Other Ambulatory Visit: Payer: Self-pay | Admitting: Internal Medicine

## 2023-10-21 ENCOUNTER — Ambulatory Visit: Payer: Self-pay | Admitting: Internal Medicine

## 2023-12-27 ENCOUNTER — Other Ambulatory Visit: Payer: Self-pay | Admitting: Internal Medicine

## 2024-02-11 ENCOUNTER — Other Ambulatory Visit: Payer: Self-pay

## 2024-02-17 ENCOUNTER — Encounter: Payer: Self-pay | Admitting: Internal Medicine

## 2024-08-25 ENCOUNTER — Other Ambulatory Visit: Payer: Self-pay

## 2024-08-30 ENCOUNTER — Encounter: Payer: Self-pay | Admitting: Internal Medicine
